# Patient Record
Sex: Male | Born: 1950 | Race: White | Hispanic: No | Marital: Married | Smoking: Never smoker
Health system: Southern US, Community
[De-identification: ages and names within clinical notes are randomized; demographics above are authoritative.]

## PROBLEM LIST (undated history)

## (undated) DIAGNOSIS — C61 Malignant neoplasm of prostate: Secondary | ICD-10-CM

## (undated) DIAGNOSIS — G4733 Obstructive sleep apnea (adult) (pediatric): Secondary | ICD-10-CM

## (undated) DIAGNOSIS — E119 Type 2 diabetes mellitus without complications: Secondary | ICD-10-CM

## (undated) DIAGNOSIS — I10 Essential (primary) hypertension: Secondary | ICD-10-CM

## (undated) HISTORY — DX: Essential (primary) hypertension: I10

## (undated) HISTORY — PX: KNEE SURGERY: SHX244

## (undated) HISTORY — DX: Obstructive sleep apnea (adult) (pediatric): G47.33

## (undated) HISTORY — DX: Malignant neoplasm of prostate: C61

## (undated) HISTORY — PX: POLYPECTOMY: SHX149

## (undated) HISTORY — PX: PROSTATE BIOPSY: SHX241

## (undated) HISTORY — PX: TONSILLECTOMY: SUR1361

## (undated) HISTORY — DX: Type 2 diabetes mellitus without complications: E11.9

---

## 2000-07-18 ENCOUNTER — Encounter: Payer: Self-pay | Admitting: Pulmonary Disease

## 2004-10-01 ENCOUNTER — Ambulatory Visit (HOSPITAL_COMMUNITY): Admission: RE | Admit: 2004-10-01 | Discharge: 2004-10-01 | Payer: Self-pay | Admitting: Internal Medicine

## 2005-12-17 ENCOUNTER — Encounter: Admission: RE | Admit: 2005-12-17 | Discharge: 2005-12-17 | Payer: Self-pay | Admitting: Internal Medicine

## 2006-02-18 ENCOUNTER — Encounter: Admission: RE | Admit: 2006-02-18 | Discharge: 2006-02-18 | Payer: Self-pay | Admitting: Internal Medicine

## 2007-11-21 ENCOUNTER — Encounter: Admission: RE | Admit: 2007-11-21 | Discharge: 2007-11-21 | Payer: Self-pay | Admitting: Internal Medicine

## 2009-06-05 ENCOUNTER — Ambulatory Visit: Payer: Self-pay | Admitting: Family Medicine

## 2009-06-05 DIAGNOSIS — I10 Essential (primary) hypertension: Secondary | ICD-10-CM

## 2009-06-05 DIAGNOSIS — M109 Gout, unspecified: Secondary | ICD-10-CM

## 2009-07-24 ENCOUNTER — Ambulatory Visit: Payer: Self-pay | Admitting: Family Medicine

## 2009-07-24 DIAGNOSIS — R197 Diarrhea, unspecified: Secondary | ICD-10-CM

## 2009-08-14 ENCOUNTER — Telehealth: Payer: Self-pay | Admitting: Family Medicine

## 2009-10-30 ENCOUNTER — Telehealth (INDEPENDENT_AMBULATORY_CARE_PROVIDER_SITE_OTHER): Payer: Self-pay | Admitting: *Deleted

## 2009-10-30 ENCOUNTER — Ambulatory Visit: Payer: Self-pay | Admitting: Family Medicine

## 2009-10-30 DIAGNOSIS — Z9189 Other specified personal risk factors, not elsewhere classified: Secondary | ICD-10-CM | POA: Insufficient documentation

## 2009-10-30 LAB — CONVERTED CEMR LAB
Glucose, Urine, Semiquant: NEGATIVE
Ketones, urine, test strip: NEGATIVE
Specific Gravity, Urine: >=1.03
WBC Urine, dipstick: NEGATIVE
pH: 5

## 2009-10-31 ENCOUNTER — Ambulatory Visit: Payer: Self-pay | Admitting: Family Medicine

## 2009-10-31 ENCOUNTER — Telehealth: Payer: Self-pay | Admitting: Family Medicine

## 2009-10-31 DIAGNOSIS — D696 Thrombocytopenia, unspecified: Secondary | ICD-10-CM

## 2009-10-31 DIAGNOSIS — R740 Nonspecific elevation of levels of transaminase and lactic acid dehydrogenase [LDH]: Secondary | ICD-10-CM

## 2009-10-31 LAB — CONVERTED CEMR LAB
AST: 123 units/L — ABNORMAL HIGH (ref 0–37)
Alkaline Phosphatase: 108 units/L (ref 39–117)
Basophils Absolute: 0 10*3/uL (ref 0.0–0.1)
Bilirubin, Direct: 0.2 mg/dL (ref 0.0–0.3)
Hemoglobin: 14.3 g/dL (ref 13.0–17.0)
Lymphocytes Relative: 8.8 % — ABNORMAL LOW (ref 12.0–46.0)
Monocytes Relative: 7.8 % (ref 3.0–12.0)
Neutro Abs: 4 10*3/uL (ref 1.4–7.7)
Neutrophils Relative %: 83.3 % — ABNORMAL HIGH (ref 43.0–77.0)
Platelets: 85 10*3/uL — ABNORMAL LOW (ref 150.0–400.0)
RDW: 13.7 % (ref 11.5–14.6)
Total Bilirubin: 1.1 mg/dL (ref 0.3–1.2)

## 2009-11-07 ENCOUNTER — Ambulatory Visit: Payer: Self-pay | Admitting: Family Medicine

## 2009-11-07 ENCOUNTER — Telehealth: Payer: Self-pay | Admitting: Internal Medicine

## 2009-11-12 LAB — CONVERTED CEMR LAB
Albumin: 4.5 g/dL (ref 3.5–5.2)
Basophils Absolute: 0 10*3/uL (ref 0.0–0.1)
HCT: 39.6 % (ref 39.0–52.0)
Lymphs Abs: 1.9 10*3/uL (ref 0.7–4.0)
MCHC: 34.8 g/dL (ref 30.0–36.0)
MCV: 85.3 fL (ref 78.0–100.0)
Monocytes Absolute: 0.5 10*3/uL (ref 0.1–1.0)
Neutro Abs: 5.9 10*3/uL (ref 1.4–7.7)
Platelets: 264 10*3/uL (ref 150.0–400.0)
RDW: 13.2 % (ref 11.5–14.6)
Total Bilirubin: 0.6 mg/dL (ref 0.3–1.2)

## 2009-12-12 ENCOUNTER — Ambulatory Visit: Payer: Self-pay | Admitting: Family Medicine

## 2009-12-17 LAB — CONVERTED CEMR LAB
AST: 25 units/L (ref 0–37)
Albumin: 4.5 g/dL (ref 3.5–5.2)
Cholesterol: 242 mg/dL — ABNORMAL HIGH (ref 0–200)
Direct LDL: 175.1 mg/dL
Total CHOL/HDL Ratio: 6
Total Protein: 6.8 g/dL (ref 6.0–8.3)
Triglycerides: 238 mg/dL — ABNORMAL HIGH (ref 0.0–149.0)
VLDL: 47.6 mg/dL — ABNORMAL HIGH (ref 0.0–40.0)

## 2010-01-10 ENCOUNTER — Encounter: Payer: Self-pay | Admitting: *Deleted

## 2010-02-05 ENCOUNTER — Telehealth: Payer: Self-pay | Admitting: Family Medicine

## 2010-02-12 ENCOUNTER — Encounter (INDEPENDENT_AMBULATORY_CARE_PROVIDER_SITE_OTHER): Payer: Self-pay | Admitting: *Deleted

## 2010-03-14 ENCOUNTER — Encounter (INDEPENDENT_AMBULATORY_CARE_PROVIDER_SITE_OTHER): Payer: Self-pay | Admitting: *Deleted

## 2010-03-17 ENCOUNTER — Ambulatory Visit: Payer: Self-pay | Admitting: Gastroenterology

## 2010-03-31 ENCOUNTER — Ambulatory Visit: Payer: Self-pay | Admitting: Gastroenterology

## 2010-04-02 ENCOUNTER — Encounter: Payer: Self-pay | Admitting: Gastroenterology

## 2010-04-07 ENCOUNTER — Ambulatory Visit: Payer: Self-pay | Admitting: Pulmonary Disease

## 2010-04-07 DIAGNOSIS — G4733 Obstructive sleep apnea (adult) (pediatric): Secondary | ICD-10-CM

## 2010-05-12 ENCOUNTER — Ambulatory Visit: Admit: 2010-05-12 | Payer: Self-pay | Admitting: Pulmonary Disease

## 2010-05-27 NOTE — Miscellaneous (Signed)
Summary: Pharmacy chg to [facility]  Clinical Lists Changes

## 2010-05-27 NOTE — Assessment & Plan Note (Signed)
Summary: new to est-ok per dr//ccm   Vital Signs:  Patient profile:   60 year old male Height:      73.50 inches Weight:      256 pounds BMI:     33.44 Temp:     98.8 degrees F oral Pulse rate:   80 / minute Pulse rhythm:   regular Resp:     12 per minute BP sitting:   160 / 90  (left arm) Cuff size:   large  Vitals Entered By: Sid Falcon LPN (June 05, 2009 2:16 PM)  Nutrition Counseling: Patient's BMI is greater than 25 and therefore counseled on weight management options.  Serial Vital Signs/Assessments:  Time      Position  BP       Pulse  Resp  Temp     By                     150/90                         Evelena Peat MD  CC: New to establish, gout history, current flare-up   History of Present Illness: New patient to establish care. Patient has history of hypertension, presumably adenomatous colon polyps, and gout.  Recent acute gout flare up involving left ankle. Used generic Lodine and no improvement until earlier today. Had significant warmth and pain with ambulation. Generally has about 3 gout flareups per year. No history of any clear tophaceous changes. Had gout for about 10 years. Adequate hydration seems to help. Previous flareups involving feet and hands.  Blood pressure treated with amlodipine, metoprolol, and Diovan HCTZ. Minimal alcohol use. He is generally aware of foods that exacerbate.  Preventive Screening-Counseling & Management  Alcohol-Tobacco     Smoking Status: never  Caffeine-Diet-Exercise     Does Patient Exercise: no  Allergies (verified): No Known Drug Allergies  Past History:  Family History: Last updated: 06/05/2009 Heart disease hypertension, parent, grandparent Diabetes, parent, grandparent  Social History: Last updated: 06/05/2009 Occupation:landscaping Married Alcohol use-yes, very occasional Never Smoked Regular exercise-no  Risk Factors: Exercise: no (06/05/2009)  Risk Factors: Smoking Status: never  (06/05/2009)  Past Medical History: Gout Hypertension PMH-FH-SH reviewed for relevance  Family History: Heart disease hypertension, parent, grandparent Diabetes, parent, grandparent  Social History: Occupation:landscaping Married Alcohol use-yes, very occasional Never Smoked Regular exercise-no Occupation:  employed Smoking Status:  never Does Patient Exercise:  no  Review of Systems      See HPI  The patient denies anorexia, fever, weight loss, weight gain, chest pain, dyspnea on exertion, abdominal pain, and severe indigestion/heartburn.    Physical Exam  General:  Well-developed,well-nourished,in no acute distress; alert,appropriate and cooperative throughout examination Mouth:  Oral mucosa and oropharynx without lesions or exudates.  Teeth in good repair. Neck:  No deformities, masses, or tenderness noted. Lungs:  Normal respiratory effort, chest expands symmetrically. Lungs are clear to auscultation, no crackles or wheezes. Heart:  Normal rate and regular rhythm. S1 and S2 normal without gallop, murmur, click, rub or other extra sounds. Extremities:  left ankle reveals significant swelling but no erythema or warmth. Mild to moderate tenderness laterally and medially. Skin:  no rashes noted   Impression & Recommendations:  Problem # 1:  GOUT (ICD-274.9)  possibly exacerbated by hydrochlorothiazide.  discussed acute treatment options. Has responded well to prednisone in the past. Prescription for Indocin will be given for acute future flareups  His updated  medication list for this problem includes:    Indomethacin 50 Mg Caps (Indomethacin) ..... One by mouth three times a day as needed gout  Problem # 2:  HYPERTENSION (ICD-401.9) suboptimally controlled today's reading.  have recommended blood pressure medication changes with getting rid of hydrochlorothiazide and titrate amlodipine 10 mg.  Work on weight loss. His updated medication list for this problem  includes:    Amlodipine Besylate 10 Mg Tabs (Amlodipine besylate) ..... One by mouth once daily    Diovan 320 Mg Tabs (Valsartan) ..... One po once daily    Metoprolol Succinate 100 Mg Xr24h-tab (Metoprolol succinate) ..... Once daily  Complete Medication List: 1)  Amlodipine Besylate 10 Mg Tabs (Amlodipine besylate) .... One by mouth once daily 2)  Diovan 320 Mg Tabs (Valsartan) .... One po once daily 3)  Metoprolol Succinate 100 Mg Xr24h-tab (Metoprolol succinate) .... Once daily 4)  Prednisone 10 Mg Tabs (Prednisone) .... Taper as follows: 4-4-4-3-3-2-1 5)  Indomethacin 50 Mg Caps (Indomethacin) .... One by mouth three times a day as needed gout  Patient Instructions: 1)  Drink plenty of fluids, especially water 2)  Limit red meats to 2 times or less per week 3)  Consider brief taper of prednisone for current flareup 4)  Please schedule a follow-up appointment in 1 month.  Prescriptions: DIOVAN 320 MG TABS (VALSARTAN) one po once daily  #30 x 11   Entered and Authorized by:   Evelena Peat MD   Signed by:   Evelena Peat MD on 06/05/2009   Method used:   Electronically to        [facility] [phone]* (retail)       [address] [place], [place]  [address]       Ph: [phone]       Fax: [phone]   RxID:   [id number] AMLODIPINE BESYLATE 10 MG TABS (AMLODIPINE BESYLATE) one by mouth once daily  #30 x 11   Entered and Authorized by:   Evelena Peat MD   Signed by:   Evelena Peat MD on 06/05/2009   Method used:   Electronically to        [facility] [address] [phone]* (retail)       [address] [place], [place]  [address]       Ph: [phone]       Fax: [phone]   RxID:   [id number] INDOMETHACIN 50 MG CAPS (INDOMETHACIN) one by mouth three times a day as needed gout  #30 x 1   Entered and Authorized by:   Evelena Peat MD   Signed by:   Evelena Peat MD on 06/05/2009   Method used:   Print then Give to Patient   RxID:    [id number] PREDNISONE 10 MG TABS (PREDNISONE) taper as follows: 4-4-4-3-3-2-1  #21 x 0   Entered and Authorized by:   Evelena Peat MD   Signed by:   Evelena Peat MD on 06/05/2009   Method used:   Print then Give to Patient   RxID:   [id number]

## 2010-05-27 NOTE — Assessment & Plan Note (Signed)
Summary: fever/chills/dm   Vital Signs:  Patient profile:   60 year old male Temp:     102 degrees F oral BP sitting:   150 / 68  (left arm) Cuff size:   large  Vitals Entered By: Sid Falcon LPN (October 30, 452 12:56 PM) CC: Fever, chills X 3  days   History of Present Illness: Patient is seen as a work in with onset 3 days ago of chills. He was at the [facility] and initially thought this was related to sun exposure.  subsequent developed fever up to 103.4 yesterday which did respond to Advil. He had some fatigue, ongoing fever and diffuse body aches. Some lower back stiffness but no urinary symptoms.  Patient has mild headache posterior occipital area which is improved with Advil and symptoms are off and on. Denies abdominal pain, diarrhea, skin rash, sore throat, earache, dysuria, or cough.  Patient has pulled off several ticks over the past 3 weeks including as recently as yesterday. He has not noted any skin rash. Minimal nausea but no vomiting.  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  The patient denies weight loss, weight gain, chest pain, prolonged cough, hemoptysis, abdominal pain, melena, hematochezia, severe indigestion/heartburn, hematuria, suspicious skin lesions, and enlarged lymph nodes.    Physical Exam  General:  Well-developed,well-nourished,in no acute distress; alert,appropriate and cooperative throughout examination Head:  Normocephalic and atraumatic without obvious abnormalities. No apparent alopecia or balding. Eyes:  pupils equal, pupils round, and pupils reactive to light.     Ears:  External ear exam shows no significant lesions or deformities.  Otoscopic examination reveals clear canals, tympanic membranes are intact bilaterally without bulging, retraction, inflammation or discharge. Hearing is grossly normal bilaterally. Mouth:  Oral mucosa and oropharynx without lesions or exudates.  Teeth in good repair. Neck:  No deformities, masses, or tenderness noted. Neck is supple. Lungs:  Normal respiratory effort, chest expands symmetrically. Lungs are clear to auscultation, no crackles or wheezes. Heart:  Normal rate and regular rhythm. S1 and S2 normal without gallop, murmur, click, rub or other extra sounds. Abdomen:  Bowel sounds positive,abdomen soft and non-tender without masses, organomegaly or hernias noted. Prostate:  Prostate gland firm and smooth, no enlargement, nodularity, tenderness, mass, asymmetry or induration. Done to r/o evid for prostatitis. Extremities:  No clubbing, cyanosis, edema, or deformity noted with normal full range of motion of all joints.   Neurologic:  alert & oriented X3, strength normal in all extremities, and gait normal.   Skin:  no rash noted Cervical Nodes:  No lymphadenopathy noted Psych:  normally interactive, good eye contact, not anxious appearing, and not depressed appearing.     Impression & Recommendations:  Problem # 1:  FEVER, HX OF (ICD-V15.9) Nonfocal exam. Obtain screening lab work. Even though no rash given history of tick bite cover with doxycycline. Discussed limitations in acute management of titers for things like [facility] spotted fever.  Patient is nontoxic in appearance this time.  No evid for UTI  clinically nothing to suggest pneumonia. Orders: UA Dipstick w/o Micro (manual) (09811) Venipuncture (91478) TLB-CBC Platelet - w/Differential (85025-CBCD) TLB-Hepatic/Liver Function Pnl (80076-HEPATIC)  Complete Medication List: 1)  Amlodipine Besylate 10 Mg Tabs (Amlodipine besylate) .... One by mouth once  daily 2)  Diovan 320 Mg Tabs (Valsartan) .... One po once daily 3)  Metoprolol Succinate 100 Mg Xr24h-tab (Metoprolol succinate) .... Once daily 4)  Indomethacin 50 Mg Caps (Indomethacin) .... One by mouth three times a day as needed gout 5)  Doxycycline Hyclate 100 Mg Caps (Doxycycline hyclate) .... One by mouth two times a day for 10 days  Patient Instructions: 1)  Schedule followup tomorrow to reassess 2)  Drink plenty of fluids 3)  Follow up immediately if you have any increased headache, confusion, worsening fever, or any new symptoms. Prescriptions: DOXYCYCLINE HYCLATE 100 MG CAPS (DOXYCYCLINE HYCLATE) one by mouth two times a day for 10 days  #20 x 0   Entered and Authorized by:   Evelena Peat MD   Signed by:   Evelena Peat MD on 10/30/2009   Method used:   Electronically to        [facility] [address] [phone]* (retail)       [address] [place], [place]  [address]       Ph: [phone]       Fax: [phone]   RxID:   [id number]   Laboratory Results   Urine Tests    Routine Urinalysis   Color: orange Appearance: Clear Glucose: negative   (Normal Range: Negative) Bilirubin: negative   (Normal Range: Negative) Ketone: negative   (Normal Range: Negative) Spec. Gravity: >=1.030   (Normal Range: 1.003-1.035) Blood: moderate   (Normal Range: Negative) pH: 5.0   (Normal Range: 5.0-8.0) Protein: 30   (Normal Range: Negative) Urobilinogen: 0.2   (Normal Range: 0-1) Nitrite: negative   (Normal Range: Negative) Leukocyte Esterace: negative   (Normal Range: Negative)    Comments: Sid Falcon LPN  October 30, 345 1:43 PM

## 2010-05-27 NOTE — Letter (Signed)
Summary: Results Letter  [facility]  [address] [place], [place] [address]   Phone: [phone]  Fax: [phone]        April 02, 2010 MRN: [id number]    Rodney Hughes [address] [place], [place]  [address]    Dear Mr. Llorens,   The polyps removed during your recent procedure was proven to be adenomatous.  These are pre-cancerous polyps that may have grown into cancers if they had not been removed.  Based on current nationally recognized surveillance guidelines, I recommend that you have a repeat colonoscopy in 5 years.  We will therefore put your information in our reminder system and will contact you in 5 years to schedule a repeat procedure.  Please call if you have any questions or concerns.       Sincerely,  Rachael Fee MD  This letter has been electronically signed by your physician.  Appended Document: Results Letter Letter mailed

## 2010-05-27 NOTE — Progress Notes (Signed)
Summary: Needs Alll Meds Refilled and Due for Colonoscopy  Phone Note Refill Request Call back at [phone]  on February 05, 2010 9:18 AM  Refills Requested: Medication #1:  AMLODIPINE BESYLATE 10 MG TABS one by mouth once daily   Notes: 90 day supply to [facility]  Medication #2:  DIOVAN 320 MG TABS one po once daily   Notes: 90 day supply to [facility]  Medication #3:  METOPROLOL SUCCINATE 100 MG XR24H-TAB once daily   Notes: 90 day supply to [facility]  Medication #4:  INDOMETHACIN 50 MG CAPS one by mouth three times a day as needed gout   Notes: 90 day supply to [facility]  Method Requested: Electronic Initial call taken by: Trixie Dredge,  February 05, 2010 9:19 AM Caller: Myrtie Hawk - PAM  Follow-up for Phone Call        Meds sent to [facility] as requested, as needed med for gout #90 only.  Wife requesting a colonscopy be scheduled at [facility], Wendall Papa MD requested.  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  Pt had colonscopy with [facility] 5 years ago.   Follow-up by: Sid Falcon LPN,  February 05, 2010 12:19 PM  Additional Follow-up for Phone Call Additional follow up Details #1::        referral made. Additional Follow-up by: Evelena Peat MD,  February 05, 2010 1:05 PM  New Problems: SPECIAL SCREENING FOR MALIGNANT NEOPLASMS COLON (ICD-V76.51)   Additional Follow-up for Phone Call Additional follow up Details #2::    Pt informed of meds and referral Follow-up by: Sid Falcon LPN,  February 05, 2010 2:48 PM  New Problems: SPECIAL SCREENING FOR MALIGNANT NEOPLASMS COLON (ICD-V76.51) Prescriptions: INDOMETHACIN 50 MG CAPS (INDOMETHACIN) one by mouth three times a day as needed gout  #90 x 0   Entered by:   Sid Falcon LPN   Authorized by:   Evelena Peat MD   Signed by:   Sid Falcon LPN on 62/13/0865   Method used:   Electronically to        [facility]* (retail)       [address]       [address] Shipping/mailing       [place], [place]  [address]       Ph: [phone]       Fax: [phone]   RxID:   [id number] DIOVAN 320 MG TABS (VALSARTAN) one po once daily  #90 x 3   Entered by:   Sid Falcon LPN   Authorized by:   Evelena Peat MD   Signed by:   Sid Falcon LPN on 75/64/3329   Method used:   Electronically to        [facility]* (retail)       [address]       [address] Shipping/mailing       [place], [place]  [address]       Ph: [phone]       Fax: [phone]   RxID:   [id number] AMLODIPINE BESYLATE 10 MG TABS (AMLODIPINE BESYLATE) one by mouth once daily  #90 x 3   Entered by:   Sid Falcon LPN   Authorized by:   Evelena Peat MD   Signed by:   Sid Falcon LPN on 31/51/7616   Method used:   Electronically to  [facility]* (retail)       [address]       [address] Shipping/mailing       [place], [place]  [address]       Ph: [phone]       Fax: [phone]   RxID:   [id number]   Pt is also due for colonoscopy, please schedule  Trixie Dredge  February 05, 2010 9:21 AM

## 2010-05-27 NOTE — Letter (Signed)
Summary: [facility] Instructions  [facility]  [address] [place], [place] [address]   Phone: [phone]  Fax: [phone]       Rodney Hughes    Jun 10, 1950    MRN: [id number]        Procedure Day Dorna Bloom:  Duanne Limerick  03/31/10     Arrival Time:  8:00AM     Procedure Time:  9:00AM     Location of Procedure:                    Juliann Pares _  [facility] (4th Floor)                      PREPARATION FOR COLONOSCOPY WITH MOVIPREP   Starting 5 days prior to your procedure 03/26/10 do not eat nuts, seeds, popcorn, corn, beans, peas,  salads, or any raw vegetables.  Do not take any fiber supplements (e.g. Metamucil, Citrucel, and Benefiber).  THE DAY BEFORE YOUR PROCEDURE         DATE: 03/30/10  DAY: MONDAY  1.  Drink clear liquids the entire day-NO SOLID FOOD  2.  Do not drink anything colored red or purple.  Avoid juices with pulp.  No orange juice.  3.  Drink at least 64 oz. (8 glasses) of fluid/clear liquids during the day to prevent dehydration and help the prep work efficiently.  CLEAR LIQUIDS INCLUDE: Water Jello Ice Popsicles Tea (sugar ok, no milk/cream) Powdered fruit flavored drinks Coffee (sugar ok, no milk/cream) Gatorade Juice: apple, white grape, white cranberry  Lemonade Clear bullion, consomm, broth Carbonated beverages (any kind) Strained chicken noodle soup Hard Candy                             4.  In the morning, mix first dose of MoviPrep solution:    Empty 1 Pouch A and 1 Pouch B into the disposable container    Add lukewarm drinking water to the top line of the container. Mix to dissolve    Refrigerate (mixed solution should be used within 24 hrs)  5.  Begin drinking the prep at 5:00 p.m. The MoviPrep container is divided by 4 marks.   Every 15 minutes drink the solution down to the next mark (approximately 8 oz) until the full liter is complete.   6.  Follow completed prep with 16 oz of clear liquid of your choice (Nothing red  or purple).  Continue to drink clear liquids until bedtime.  7.  Before going to bed, mix second dose of MoviPrep solution:    Empty 1 Pouch A and 1 Pouch B into the disposable container    Add lukewarm drinking water to the top line of the container. Mix to dissolve    Refrigerate  THE DAY OF YOUR PROCEDURE      DATE: 03/31/10   DAY: MONDAY  Beginning at 4:00PM (5 hours before procedure):         1. Every 15 minutes, drink the solution down to the next mark (approx 8 oz) until the full liter is complete.  2. Follow completed prep with 16 oz. of clear liquid of your choice.    3. You may drink clear liquids until 7:00PM (2 HOURS BEFORE PROCEDURE).   MEDICATION INSTRUCTIONS  Unless otherwise instructed, you should take regular prescription medications with a small sip of water   as early as possible the morning of  your procedure.           OTHER INSTRUCTIONS  You will need a responsible adult at least 58 years of age to accompany you and drive you home.   This person must remain in the waiting room during your procedure.  Wear loose fitting clothing that is easily removed.  Leave jewelry and other valuables at home.  However, you may wish to bring a book to read or  an iPod/MP3 player to listen to music as you wait for your procedure to start.  Remove all body piercing jewelry and leave at home.  Total time from sign-in until discharge is approximately 2-3 hours.  You should go home directly after your procedure and rest.  You can resume normal activities the  day after your procedure.  The day of your procedure you should not:   Drive   Make legal decisions   Operate machinery   Drink alcohol   Return to work  You will receive specific instructions about eating, activities and medications before you leave.    The above instructions have been reviewed and explained to me by   Sherren Kerns RN  March 17, 2010 4:34 PM    I fully understand and can  verbalize these instructions _____________________________ Date _________

## 2010-05-27 NOTE — Miscellaneous (Signed)
Summary: previsit prep/rm  Clinical Lists Changes  Medications: Added new medication of MOVIPREP 100 GM  SOLR (PEG-KCL-NACL-NASULF-NA ASC-C) As per prep instructions. - Signed Rx of MOVIPREP 100 GM  SOLR (PEG-KCL-NACL-NASULF-NA ASC-C) As per prep instructions.;  #1 x 0;  Signed;  Entered by: Sherren Kerns RN;  Authorized by: Rachael Fee MD;  Method used: Electronically to [facility]*, [address], [address] Shipping/mailing, [place], [place]  [address], Ph: [phone], Fax: [phone] Observations: Added new observation of ALLERGY REV: Done (03/17/2010 16:08)    Prescriptions: MOVIPREP 100 GM  SOLR (PEG-KCL-NACL-NASULF-NA ASC-C) As per prep instructions.  #1 x 0   Entered by:   Sherren Kerns RN   Authorized by:   Rachael Fee MD   Signed by:   Sherren Kerns RN on 03/17/2010   Method used:   Electronically to        [facility]* (retail)       [address]       [address] Shipping/mailing       [place], [place]  [address]       Ph: [phone]       Fax: [phone]   RxID:   [id number]

## 2010-05-27 NOTE — Progress Notes (Signed)
Summary: Fever returned 4pm, 101.8  Phone Note Call from Patient Call back at [facility] Phone [phone]   Caller: Patient Call For: Evelena Peat MD Summary of Call: VM from pt reporting 4pm today his fever returned, 101.8.  He will call with an update as to how the evening goes Initial call taken by: Sid Falcon LPN,  November 01, 2438 4:35 PM  Follow-up for Phone Call        called pt back and left message. Follow-up by: Evelena Peat MD,  October 31, 2009 5:45 PM  Additional Follow-up for Phone Call Additional follow up Details #1::        spoke with pt.  Overall feels better.  Fever is down at this time. Additional Follow-up by: Evelena Peat MD,  November 01, 2009 10:36 AM

## 2010-05-27 NOTE — Assessment & Plan Note (Signed)
Summary: DIARRHEA (POST TRIP TO [place]) // RS   Vital Signs:  Patient profile:   60 year old male Temp:     98.0 degrees F oral BP sitting:   130 / 80  (left arm) Cuff size:   large  Vitals Entered By: Sid Falcon LPN (July 24, 2009 3:26 PM) CC: [place] vacation, diarrhea X 3 days, Diarrhea   History of Present Illness:  Diarrhea      This is a 60 year old man who presents with Diarrhea.  The patient complains of >6 stools per day and watery/unformed stools, but denies blood in stool.  The patient denies fever, abdominal pain, abdominal cramps, nausea, and vomiting.  Recent travel to [place] but symptoms did not start until back for 2 days.  No Imodium or any other alleviating factors.  No recent antibiiotics.  Hx gout.  No flares since last visit.  Taken off HCTZ to avoid exacerbating.  Htn with increase amlodipine recently and tolerating well.  No headaches, dizziness, or edema.  R ant ankle pain worse with movement after periods of sitting and gradually improves some with walking.  No injury and no recent change of shoewear.  Has not iced or taken any antiinflammatories.  No redness or warmth.  Allergies (verified): No Known Drug Allergies  Past History:  Past Medical History: Last updated: 06/05/2009 Gout Hypertension PMH reviewed for relevance  Review of Systems      See HPI  Physical Exam  General:  Well-developed,well-nourished,in no acute distress; alert,appropriate and cooperative throughout examination Head:  Normocephalic and atraumatic without obvious abnormalities. No apparent alopecia or balding. Mouth:  Oral mucosa and oropharynx without lesions or exudates.  Teeth in good repair. Lungs:  Normal respiratory effort, chest expands symmetrically. Lungs are clear to auscultation, no crackles or wheezes. Heart:  Normal rate and regular rhythm. S1 and S2 normal without gallop, murmur, click, rub or other extra sounds. Abdomen:  soft, non-tender, normal bowel  sounds, no distention, no masses, no guarding, no rigidity, no hepatomegaly, and no splenomegaly.   Extremities:  right ankle reveals full range of motion. Minimal tenderness near anterior tendons of the ankle. No erythema or warmth. No bony tenderness. Good distal foot pulses. Skin:  Intact without suspicious lesions or rashes   Impression & Recommendations:  Problem # 1:  DIARRHEA (ICD-787.91) ?viral .  Pt does not appear signif dehydrated.  No fever, bloody stools, etc to suggest likely bacterial source.  Hydrate and try Imodium and close follow up if symptoms not improving next few days.  Problem # 2:  HYPERTENSION (ICD-401.9) Assessment: Improved  His updated medication list for this problem includes:    Amlodipine Besylate 10 Mg Tabs (Amlodipine besylate) ..... One by mouth once daily    Diovan 320 Mg Tabs (Valsartan) ..... One po once daily    Metoprolol Succinate 100 Mg Xr24h-tab (Metoprolol succinate) ..... Once daily  Problem # 3:  GOUT (ICD-274.9) Assessment: Improved  His updated medication list for this problem includes:    Indomethacin 50 Mg Caps (Indomethacin) ..... One by mouth three times a day as needed gout  Problem # 4:  OTHER ENTHESOPATHY OF ANKLE AND TARSUS (ICD-726.79) try icing and stretching.  Complete Medication List: 1)  Amlodipine Besylate 10 Mg Tabs (Amlodipine besylate) .... One by mouth once daily 2)  Diovan 320 Mg Tabs (Valsartan) .... One po once daily 3)  Metoprolol Succinate 100 Mg Xr24h-tab (Metoprolol succinate) .... Once daily 4)  Prednisone 10 Mg Tabs (Prednisone) .Marland KitchenMarland KitchenMarland Kitchen  Taper as follows: 4-4-4-3-3-2-1 5)  Indomethacin 50 Mg Caps (Indomethacin) .... One by mouth three times a day as needed gout  Patient Instructions: 1)  Drink plenty of fluids. 2)  Consider over the counter Imodium as needed for diarrhea. 3)  Call or follow up promptly if you notice any bloody stools, fever, or worsening diarrhea or if diarrhea not improving over the next few  days. 4)  Eat more potassium rich foods such as bananas, oranje juice, and salt substitutes .  5)  Consider icing R ankle several times daily.

## 2010-05-27 NOTE — Progress Notes (Signed)
Summary: Fever & Headache  Phone Note Call from Patient Call back at Home Phone [phone]   Caller: Patient Call For: Evelena Peat MD Summary of Call: Fever of 103 with headache for the past 4 days would like to know if he can be seen today?  Pt went to the beach this weekend, and Sunday night he began having back pain, and having ? of chills?  Fever was the first day of fever, and last night temp was 103 4.  Taking Advil since. Has headache, and no appetite, no URI symptoms, no vomiting or diarrhea, no tick bite.  Did find tick today.  No rash.   Initial call taken by: Trixie Dredge,  October 30, 2009 11:52 AM  Follow-up for Phone Call        Per Dr Caryl Never .......can see pt at 1 pm Follow-up by: Lynann Beaver CMA,  October 30, 2009 12:02 PM

## 2010-05-27 NOTE — Procedures (Signed)
Summary: Colonoscopy  Patient: Rodney Hughes Note: All result statuses are Final unless otherwise noted.  Tests: (1) Colonoscopy (COL)   COL Colonoscopy           DONE     [facility]     [address]     [place], [place]  [address]           COLONOSCOPY PROCEDURE REPORT           PATIENT:  [name], [name]  MR#:  [id number]     BIRTHDATE:  04-17-1951, 59 yrs. old  GENDER:  male     ENDOSCOPIST:  Rachael Fee, MD     REF. BY:  Evelena Peat, M.D.     PROCEDURE DATE:  03/31/2010     PROCEDURE:  Colonoscopy with snare polypectomy     ASA CLASS:  Class II     INDICATIONS:  had "cancerous polyp" removed 15 years ago in     [place].  Has had 4-5 colonoscopies since then, probably removal     of another polpy as well.     MEDICATIONS:   Fentanyl 50 mcg IV, Versed 5 mg IV     DESCRIPTION OF PROCEDURE:   After the risks benefits and     alternatives of the procedure were thoroughly explained, informed     consent was obtained.  Digital rectal exam was performed and     revealed no rectal masses.   The LB 180AL [id number] endoscope was     introduced through the anus and advanced to the cecum, which was     identified by both the appendix and ileocecal valve, without     limitations.  The quality of the prep was good, using MoviPrep.     The instrument was then slowly withdrawn as the colon was fully     examined.           <<PROCEDUREIMAGES>>     FINDINGS:  A sessile polyp was found in the ascending colon. This     was 4mm across, removed with cold snare and sent to pathology (jar     1) (see image4).  This was otherwise a normal examination of the     colon (see image2, image1, and image5).   Retroflexed views in the     rectum revealed no abnormalities.    The scope was then withdrawn     from the patient and the procedure completed.     COMPLICATIONS:  None           ENDOSCOPIC IMPRESSION:     1) Small sessile polyp in the ascending colon, removed and sent     to  pathology     2) Otherwise normal examination           RECOMMENDATIONS:     1) You will receive a letter within 1-2 weeks with the results     of your biopsy as well as final recommendations. Please call my     office if you have not received a letter after 3 weeks.  You will     likely need a repeat colonoscopy in 5 years     2) Dr. Christella Hartigan' office will get in touch with you in order to have     records sent from [place] colonoscopy, pathology report.  Also     will get records from [facility] sent over.           ______________________________  Rachael Fee, MD           n.     Rosalie Doctor:   Rachael Fee at 03/31/2010 09:27 AM           Docia Chuck, [id number]  Note: An exclamation mark (!) indicates a result that was not dispersed into the flowsheet. Document Creation Date: 03/31/2010 9:28 AM _______________________________________________________________________  (1) Order result status: Final Collection or observation date-time: 03/31/2010 09:21 Requested date-time:  Receipt date-time:  Reported date-time:  Referring Physician:   Ordering Physician: Rob Bunting [phone]) Specimen Source:  Source: Launa Grill Order Number: [id number] Lab site:   Appended Document: Colonoscopy     Procedures Next Due Date:    Colonoscopy: 03/2015

## 2010-05-27 NOTE — Letter (Signed)
Summary: Pre Visit Letter Revised  [facility]  [address] [place], [place] [address]   Phone: [phone]  Fax: [phone]        02/12/2010 MRN: [id number] Rodney Hughes [address] [place], [place]  [address]             Procedure Date:  03/31/2010   Welcome to the Gastroenterology Division at [facility].    You are scheduled to see a nurse for your pre-procedure visit on 03/17/2010 at 4:30PM on the 3rd floor at [facility], [address]  We ask that you try to arrive at our office 15 minutes prior to your appointment time to allow for check-in.  Please take a minute to review the attached form.  If you answer "Yes" to one or more of the questions on the first page, we ask that you call the person listed at your earliest opportunity.  If you answer "No" to all of the questions, please complete the rest of the form and bring it to your appointment.    Your nurse visit will consist of discussing your medical and surgical history, your immediate family medical history, and your medications.   If you are unable to list all of your medications on the form, please bring the medication bottles to your appointment and we will list them.  We will need to be aware of both prescribed and over the counter drugs.  We will need to know exact dosage information as well.    Please be prepared to read and sign documents such as consent forms, a financial agreement, and acknowledgement forms.  If necessary, and with your consent, a friend or relative is welcome to sit-in on the nurse visit with you.  Please bring your insurance card so that we may make a copy of it.  If your insurance requires a referral to see a specialist, please bring your referral form from your primary care physician.  No co-pay is required for this nurse visit.     If you cannot keep your appointment, please call [phone] to cancel or reschedule prior to your appointment date.  This  allows [place] the opportunity to schedule an appointment for another patient in need of care.    Thank you for choosing [facility] for your medical needs.  We appreciate the opportunity to care for you.  Please visit [place] at our website  to learn more about our practice.  Sincerely, The Gastroenterology Division

## 2010-05-27 NOTE — Assessment & Plan Note (Signed)
Summary: 1 day reassess per Dr. Hoover Grewe//alp   Vital Signs:  Patient profile:   60 year old male Temp:     98.0 degrees F oral BP sitting:   118 / 70  (left arm) Cuff size:   large  Vitals Entered By: Sid Falcon LPN (November 01, 9602 10:02 AM) CC: one day re-assess   History of Present Illness: Overall feels somewhat better.  Still some fever.  Nausea with Doxy. Still no rash.  Some body aches but improved last night. Very mild headache-better. Labs signif for low platelets and elev liver transaminases. No abdominal pain.  No vomiting today.  Allergies (verified): No Known Drug Allergies  Past History:  Past Medical History: Last updated: 06/05/2009 Gout Hypertension  Review of Systems      See HPI  Physical Exam  General:  Well-developed,well-nourished,in no acute distress; alert,appropriate and cooperative throughout examination Ears:  External ear exam shows no significant lesions or deformities.  Otoscopic examination reveals clear canals, tympanic membranes are intact bilaterally without bulging, retraction, inflammation or discharge. Hearing is grossly normal bilaterally. Mouth:  Oral mucosa and oropharynx without lesions or exudates.  Teeth in good repair. Neck:  No deformities, masses, or tenderness noted. supple Lungs:  Normal respiratory effort, chest expands symmetrically. Lungs are clear to auscultation, no crackles or wheezes. Heart:  Normal rate and regular rhythm. S1 and S2 normal without gallop, murmur, click, rub or other extra sounds. Skin:  no rashes.   Cervical Nodes:  No lymphadenopathy noted   Impression & Recommendations:  Problem # 1:  FEVER, HX OF (ICD-V15.9) Assessment Improved ?tick borne illness.  Low platelets and elev liver enzymes and hx tick bite compatible. Cont Doxycline.  consider acute hepatitis serologies if fever not continuing down next couple of days.  Problem # 2:  THROMBOCYTOPENIA (ICD-287.5) ?sec to tick  fever.  Problem # 3:  TRANSAMINASES, SERUM, ELEVATED (ICD-790.4) No hx of signif ETOH use.  Check acute Hep A serologies if not promptly improving. recheck one week.  Complete Medication List: 1)  Amlodipine Besylate 10 Mg Tabs (Amlodipine besylate) .... One by mouth once daily 2)  Diovan 320 Mg Tabs (Valsartan) .... One po once daily 3)  Metoprolol Succinate 100 Mg Xr24h-tab (Metoprolol succinate) .... Once daily 4)  Indomethacin 50 Mg Caps (Indomethacin) .... One by mouth three times a day as needed gout 5)  Doxycycline Hyclate 100 Mg Caps (Doxycycline hyclate) .... One by mouth two times a day for 10 days  Patient Instructions: 1)  Finish Doxycycline and follow up immediately if symptoms worsen in any way. 2)  Schedule the following labs in one week: 3)  CBC  287.5 4)  hepatic panel  790.4

## 2010-05-27 NOTE — Progress Notes (Signed)
Summary: pt is out  Phone Note Call from Patient Call back at [facility] Phone [phone]   Caller: Patient Call For: Evelena Peat MD Summary of Call: pt needs refill on metoprolol 100 mg call into [facility] [phone]. pt is out Initial call taken by: Heron Sabins,  November 07, 2009 3:41 PM  Follow-up for Phone Call        #90 Follow-up by: Gordy Savers  MD,  November 07, 2009 4:52 PM    Prescriptions: METOPROLOL SUCCINATE 100 MG XR24H-TAB (METOPROLOL SUCCINATE) once daily  #90 x 0   Entered by:   Lynann Beaver CMA   Authorized by:   Gordy Savers  MD   Signed by:   Lynann Beaver CMA on 11/07/2009   Method used:   Electronically to        [facility] [place] [id number] (retail)       [address] [place], [place]  [address]       Ph: [phone]       Fax: [phone]   RxID:   [id number]

## 2010-05-27 NOTE — Progress Notes (Signed)
Summary: Update on gout flare up  Phone Note Call from Patient   Caller: Spouse Call For: Evelena Peat MD Summary of Call: Wife reporting her husband has been limping the past few days, he did fill the Indomethacin.  With holiday weekend coming up I called pt in follow-up to see if he was doing OK, was the med helping.  He reported he was taking med three times a day and he was seeing improvement. Offered to see of one of the other providers would authorize refilling Prednisone, pt said no need at this time.  Pt was instructed to take the Indomethacin until his symptoms have resolved.  Pt voiced his understanding, he still has one refill at pharmacy.  Encouraged pt to call with questions, concerns regarding his gout symptoms. Initial call taken by: Sid Falcon LPN,  August 14, 2009 10:47 AM  Follow-up for Phone Call        agree with advice.  If continues to have freq flares we may wish to look at prophylactic meds.  We are hoping that eimination of his HCTZ (for blood pressure) will reduce his frequency of attacks. Follow-up by: Evelena Peat MD,  August 18, 2009 9:57 PM  Additional Follow-up for Phone Call Additional follow up Details #1::        PT INFORMED Additional Follow-up by: Willy Eddy, LPN,  August 19, 2009 9:23 AM

## 2010-05-29 NOTE — Assessment & Plan Note (Signed)
Summary: self referral for management of osa   Copy to:  self referral Primary Provider/Referring Provider:  Dr. Caryl Never MD  CC:  pt currently on a cpap but havent used x 2 years due to drying out his throat.  and Abdominal Pain.  History of Present Illness: The pt is a 59y/o male who comes in today as a self referral for management of osa.  He was diagnosed 10 years ago with osa, and has been on cpap since that time.  He is using the same machine, and does not have a heated humidifier.  He has not kept up with mask changes, and has had leaks with nasal mask (but unsure if due to mouth opening).  He states that he has not used his cpap consistently in 2 years due to the above issues.  He has been noted to have loud snoring, as well as an abnormal breathing pattern during sleep per wife.  He goes to bed at 10-11pm, and arises at 630am to start his day.  He is not completely rested upon arising in the am's, but denies daytime sleepiness with periods of inactivity.  He can doze however, in the evening while watching tv or movies.  He denies any sleepiness with driving.  His weight is neutral over the last 2 years.     Current Medications (verified): 1)  Amlodipine Besylate 10 Mg Tabs (Amlodipine Besylate) .... One By Mouth Once Daily 2)  Diovan 320 Mg Tabs (Valsartan) .... One Po Once Daily 3)  Metoprolol Succinate 100 Mg Xr24h-Tab (Metoprolol Succinate) .... Once Daily 4)  Indomethacin 50 Mg Caps (Indomethacin) .... One By Mouth Three Times A Day As Needed Gout  Allergies (verified): No Known Drug Allergies  Past History:  Past Medical History: Gout Hypertension OSA  Past Surgical History: polyps removed from colon tonsillectomy knee surgery  Family History: Reviewed history from 06/05/2009 and no changes required. hypertension, father dm--father allergies father  Social History: Reviewed history from 06/05/2009 and no changes  required. Occupation:landscaping Married Alcohol use-yes, very occasional Never Smoked Regular exercise-no  Review of Systems       The patient complains of nasal congestion/difficulty breathing through nose.  The patient denies shortness of breath with activity, shortness of breath at rest, productive cough, non-productive cough, coughing up blood, chest pain, irregular heartbeats, acid heartburn, indigestion, loss of appetite, weight change, abdominal pain, difficulty swallowing, sore throat, tooth/dental problems, headaches, sneezing, itching, ear ache, anxiety, depression, hand/feet swelling, joint stiffness or pain, rash, change in color of mucus, and fever.    Vital Signs:  Patient profile:   60 year old male Height:      73.50 inches Weight:      257.38 pounds BMI:     33.62 O2 Sat:      98 % on Room air Temp:     98.5 degrees F oral Pulse rate:   70 / minute BP sitting:   130 / 78  (left arm) Cuff size:   large  Vitals Entered By: Carver Fila (April 07, 2010 3:06 PM)  O2 Flow:  Room air CC: pt currently on a cpap but havent used x 2 years due to drying out his throat. , Abdominal Pain Comments meds and allergies updated Phone number updated Carver Fila  April 07, 2010 3:07 PM    Physical Exam  General:  ow male in nad  Eyes:  PERRLA and EOMI.   Nose:  narrowed bilat with mucosal edema and erythema Mouth:  small posterior pharyngeal space, elongation of soft palate and uvula. Neck:  no jvd, tmg, LN Lungs:  clear to auscultation Heart:  rrr, no mrg  Abdomen:  soft and nontender, bs+ Extremities:  no edema or cyanosis  pulses intact distally Neurologic:  alert and oriented, does not appear sleepy, moves all 4.   Impression & Recommendations:  Problem # 1:  OBSTRUCTIVE SLEEP APNEA (ICD-327.23) the pt has a history osa dating back 10 years, but has not been compliant with cpap over the last 2 years due to machine and mask issues.  He is having a lot of nasal  dryness due to lack of heated humidifier, is unsure if his machine is set properly or even working properly, and has old masks that are leaking.  At this point, will need a new machine and mask, and will re-optimize his pressure on auto mode.  I have also encouraged him to work on weight loss.  Other Orders: New Patient Level IV ([address]) DME Referral (DME)  Patient Instructions: 1)  will get you a new machine, mask, and supplies.  Will start out on auto pressure mode for the next 2-3 weeks, then will set up on a fixed pressure based on the download.  Please call if having issues with tolerance. 2)  work on weight loss 3)  followup with me in 5 weeks.   Immunization History:  Influenza Immunization History:    Influenza:  historical (01/25/2009)

## 2010-08-20 ENCOUNTER — Encounter: Payer: Self-pay | Admitting: Pulmonary Disease

## 2010-08-22 ENCOUNTER — Encounter: Payer: Self-pay | Admitting: Pulmonary Disease

## 2010-08-25 ENCOUNTER — Ambulatory Visit (INDEPENDENT_AMBULATORY_CARE_PROVIDER_SITE_OTHER): Payer: 59 | Admitting: Pulmonary Disease

## 2010-08-25 ENCOUNTER — Encounter: Payer: Self-pay | Admitting: Pulmonary Disease

## 2010-08-25 VITALS — BP 148/92 | HR 64 | Temp 98.0°F | Ht 74.0 in | Wt 253.8 lb

## 2010-08-25 DIAGNOSIS — G4733 Obstructive sleep apnea (adult) (pediatric): Secondary | ICD-10-CM

## 2010-08-25 NOTE — Progress Notes (Signed)
  Subjective:    Patient ID: Rodney Hughes, male    DOB: 1951/02/16, 60 y.o.   MRN: [id number]  HPI The pt comes in today for f/u of his osa.  His download from Jan to March shows poor compliance overall, but he states the last 4 weeks he has done much better.  He denies any issues with mask fit, and thinks he is sleeping better with increased alertness.  His optimal pressure appears to be 12cm.    Review of Systems  Constitutional: Negative for fever and unexpected weight change.  HENT: Positive for rhinorrhea. Negative for ear pain, nosebleeds, congestion, sore throat, sneezing, trouble swallowing, dental problem, postnasal drip and sinus pressure.   Eyes: Positive for redness and itching.  Respiratory: Negative for cough, chest tightness, shortness of breath and wheezing.   Cardiovascular: Negative for palpitations and leg swelling.  Gastrointestinal: Negative for nausea and vomiting.  Genitourinary: Negative for dysuria.  Musculoskeletal: Negative for joint swelling.  Skin: Negative for rash.  Neurological: Negative for headaches.  Hematological: Does not bruise/bleed easily.  Psychiatric/Behavioral: Negative for dysphoric mood. The patient is not nervous/anxious.        Objective:   Physical Exam Ow male in nad No skin breakdown or pressure necrosis from cpap mask LE without edema, no cyanosis noted. Alert , oriented, does not appear sleepy.  Moves all 4        Assessment & Plan:

## 2010-08-25 NOTE — Assessment & Plan Note (Signed)
The pt is on cpap in auto mode currently, and although his overall compliance is poor, his states his most recent compliance is improved.  He would like to continue with therapy, and will have his machine set on a pressure of 12cm based on the download.  I have also encouraged him to work on weight loss.

## 2010-08-25 NOTE — Patient Instructions (Signed)
Will have advanced increase your pressure to 12 on set mode Decide if you need to have ramp feature or not.  You can turn this off if not.  Work on weight loss followup with me in 6mos

## 2010-11-03 ENCOUNTER — Other Ambulatory Visit: Payer: Self-pay | Admitting: Family Medicine

## 2010-12-03 ENCOUNTER — Other Ambulatory Visit: Payer: Self-pay | Admitting: Family Medicine

## 2010-12-03 MED ORDER — METOPROLOL SUCCINATE ER 100 MG PO TB24: 100.0000 mg | ORAL_TABLET | Freq: Every day | ORAL | Status: DC

## 2010-12-03 NOTE — Telephone Encounter (Signed)
Pt's wife called 8/8. Husband has a f/u appt on 8/13 but does not have enough Metroprolol to get him through. Wife would like 1-2 pills called in to the new [facility] pharmacy over at [place].

## 2010-12-03 NOTE — Telephone Encounter (Signed)
Per dr. Caryl Never - ok 14 - keep appt next week. kik

## 2010-12-08 ENCOUNTER — Ambulatory Visit (INDEPENDENT_AMBULATORY_CARE_PROVIDER_SITE_OTHER): Payer: 59 | Admitting: Family Medicine

## 2010-12-08 ENCOUNTER — Telehealth: Payer: Self-pay | Admitting: Family Medicine

## 2010-12-08 ENCOUNTER — Encounter: Payer: Self-pay | Admitting: Family Medicine

## 2010-12-08 DIAGNOSIS — G4733 Obstructive sleep apnea (adult) (pediatric): Secondary | ICD-10-CM

## 2010-12-08 DIAGNOSIS — M109 Gout, unspecified: Secondary | ICD-10-CM

## 2010-12-08 DIAGNOSIS — I10 Essential (primary) hypertension: Secondary | ICD-10-CM

## 2010-12-08 NOTE — Telephone Encounter (Signed)
I routed a phone note back on this pt dated 8/8. His wife, who works at [employer], requested that his Metoprolol be called in to the [facility] out pt pharmacy - the new one - at [address]. It looks like it was called in to the one at [facility]. She just called back, somewhat miffed, and would like [place] to re-route it over to the one on [address] ASAP as her husband is out of the meds and her daughter is waiting to pick it up.

## 2010-12-08 NOTE — Progress Notes (Signed)
  Subjective:    Patient ID: Rodney Hughes, male    DOB: 1950-05-11, 60 y.o.   MRN: [id number]  HPI Medical followup. Past medical history significant for gout, hypertension, obstructive sleep apnea, and prior history of elevated liver transaminases. Medications reviewed. Compliant with all. Home blood pressures mostly ranging 118 -140 systolic and 80s diastolic. Denies any dizziness or orthostasis. No headaches. No recent chest pains. Stays active physically with landscaping work.  History of gout with less frequent flareups since stopping hydrochlorothiazide over one year ago. Has taken Indocin in the past for flareups. He does not report any gout flares in the past one year. Obstructive sleep apnea and recently had equipment replaced with auto titration unit.  Overall feels well  Has had some fleeting numbness in both feet. Not related to shoe wear. Check blood sugar with his wife's equipment and fasting blood sugar was normal.  Past Medical History  Diagnosis Date  . Gout   . Hypertension   . OSA (obstructive sleep apnea)    Past Surgical History  Procedure Date  . Polypectomy   . Tonsillectomy   . Knee surgery     reports that he has never smoked. He does not have any smokeless tobacco history on file. He reports that he drinks alcohol. His drug history not on file. family history includes Allergies in his father; Diabetes in his father; and Hypertension in his father. No Known Allergies   Review of Systems  Constitutional: Negative for fever, appetite change and unexpected weight change.  Respiratory: Negative for cough and shortness of breath.   Cardiovascular: Negative for chest pain, palpitations and leg swelling.  Gastrointestinal: Negative for abdominal pain.  Musculoskeletal: Negative for myalgias.  Neurological: Negative for dizziness and headaches.       Objective:   Physical Exam  Constitutional: He is oriented to person, place, and time. He appears  well-developed and well-nourished.  Eyes: Pupils are equal, round, and reactive to light.  Neck: Neck supple. No thyromegaly present.  Cardiovascular: Normal rate and regular rhythm.  Exam reveals no gallop.   No murmur heard. Pulmonary/Chest: Breath sounds normal. No respiratory distress. He has no wheezes. He has no rales.  Musculoskeletal: He exhibits no edema.  Lymphadenopathy:    He has no cervical adenopathy.  Neurological: He is alert and oriented to person, place, and time.  Psychiatric: He has a normal mood and affect. His behavior is normal.          Assessment & Plan:  #1 hypertension. Marginal control. Prefer that he work on weight loss and reassess him in 6 months. Continue home monitoring.  He will try to establish more regular exercise #2 gout.  Infrequent flareups of this time. No indication for prophylaxis #3 obstructive sleep apnea. Continue weight loss efforts and CPAP

## 2010-12-08 NOTE — Telephone Encounter (Signed)
I spoke with [facility] Pharmacist and  [facility] Pharmacist, they are both able to see refill authorization, no daughter at [facility] [employer]???  I see that pt is scheduled for appt this afternoon.  Pt last OV was 10/2009.  I filled Metoprolol in July #30 with 0 refills, informed pharmacy pt needed return OV, another refill request came in August, refilled #14 with 0 refills, again reminding pt needs return OV.  The refill requests came electronically from the [facility], so that is where I sent the refills.

## 2010-12-08 NOTE — Telephone Encounter (Signed)
Refills done at OV today

## 2011-01-01 ENCOUNTER — Other Ambulatory Visit: Payer: Self-pay | Admitting: Family Medicine

## 2011-02-02 ENCOUNTER — Other Ambulatory Visit: Payer: Self-pay | Admitting: Family Medicine

## 2011-02-23 ENCOUNTER — Ambulatory Visit: Payer: 59 | Admitting: Pulmonary Disease

## 2011-04-03 ENCOUNTER — Encounter: Payer: Self-pay | Admitting: Pulmonary Disease

## 2011-04-03 ENCOUNTER — Ambulatory Visit (INDEPENDENT_AMBULATORY_CARE_PROVIDER_SITE_OTHER): Payer: 59 | Admitting: Pulmonary Disease

## 2011-04-03 VITALS — BP 150/84 | HR 60 | Temp 98.1°F | Ht 74.0 in | Wt 256.6 lb

## 2011-04-03 DIAGNOSIS — G4733 Obstructive sleep apnea (adult) (pediatric): Secondary | ICD-10-CM

## 2011-04-03 NOTE — Progress Notes (Signed)
  Subjective:    Patient ID: Rodney Hughes, male    DOB: Mar 06, 1951, 60 y.o.   MRN: [id number]  HPI The patient comes in today for followup of his obstructive sleep apnea.  He is wearing CPAP compliantly, and denies any issue with the mask fit or pressure.  He feels that he is sleeping well, and has excellent daytime alertness.   Review of Systems  Constitutional: Negative for fever and unexpected weight change.  HENT: Negative for ear pain, nosebleeds, congestion, sore throat, rhinorrhea, sneezing, trouble swallowing, dental problem, postnasal drip and sinus pressure.   Eyes: Negative for redness and itching.  Respiratory: Negative for cough, chest tightness, shortness of breath and wheezing.   Cardiovascular: Negative for palpitations and leg swelling.  Gastrointestinal: Negative for nausea and vomiting.  Genitourinary: Negative for dysuria.  Musculoskeletal: Negative for joint swelling.  Skin: Negative for rash.  Neurological: Negative for headaches.  Hematological: Does not bruise/bleed easily.  Psychiatric/Behavioral: Negative for dysphoric mood. The patient is not nervous/anxious.        Objective:   Physical Exam Overweight male in no acute distress No skin breakdown or pressure necrosis from the CPAP mask Lower extremities without edema, no cyanosis Alert and oriented, does not appear to be sleepy, moves all 4 extremities.       Assessment & Plan:

## 2011-04-03 NOTE — Assessment & Plan Note (Signed)
The patient is doing very well on CPAP, with improvement in his symptoms.  I have asked him to continue on CPAP, keep up with mask changes and supplies, and to work aggressively on weight loss.  He is to followup with me in one year he is doing well.

## 2011-04-03 NOTE — Patient Instructions (Signed)
Continue with cpap. Work on weight loss followup with me in one year.  

## 2011-05-05 ENCOUNTER — Other Ambulatory Visit: Payer: Self-pay | Admitting: Family Medicine

## 2011-05-05 ENCOUNTER — Other Ambulatory Visit: Payer: Self-pay | Admitting: *Deleted

## 2011-05-05 MED ORDER — METOPROLOL SUCCINATE ER 100 MG PO TB24: 100.0000 mg | ORAL_TABLET | Freq: Every day | ORAL | Status: DC

## 2011-06-03 ENCOUNTER — Other Ambulatory Visit (INDEPENDENT_AMBULATORY_CARE_PROVIDER_SITE_OTHER): Payer: 59

## 2011-06-03 DIAGNOSIS — Z0000 Encounter for general adult medical examination without abnormal findings: Secondary | ICD-10-CM

## 2011-06-03 LAB — CBC WITH DIFFERENTIAL/PLATELET
Basophils Relative: 0.3 % (ref 0.0–3.0)
Eosinophils Relative: 6.1 % — ABNORMAL HIGH (ref 0.0–5.0)
MCV: 88.3 fl (ref 78.0–100.0)
Monocytes Absolute: 0.4 10*3/uL (ref 0.1–1.0)
Monocytes Relative: 7.9 % (ref 3.0–12.0)
Neutrophils Relative %: 61.1 % (ref 43.0–77.0)
Platelets: 132 10*3/uL — ABNORMAL LOW (ref 150.0–400.0)
RBC: 4.46 Mil/uL (ref 4.22–5.81)
WBC: 5.5 10*3/uL (ref 4.5–10.5)

## 2011-06-03 LAB — BASIC METABOLIC PANEL
BUN: 18 mg/dL (ref 6–23)
Chloride: 106 mEq/L (ref 96–112)
Creatinine, Ser: 1.1 mg/dL (ref 0.4–1.5)
GFR: 73.03 mL/min (ref >60.00)
Potassium: 4.4 mEq/L (ref 3.5–5.1)

## 2011-06-03 LAB — POCT URINALYSIS DIPSTICK
Bilirubin, UA: NEGATIVE
Blood, UA: NEGATIVE
Glucose, UA: NEGATIVE
Leukocytes, UA: NEGATIVE
Nitrite, UA: NEGATIVE
Urobilinogen, UA: 0.2

## 2011-06-03 LAB — HEPATIC FUNCTION PANEL
ALT: 19 U/L (ref 0–53)
AST: 22 U/L (ref 0–37)
Alkaline Phosphatase: 52 U/L (ref 39–117)
Bilirubin, Direct: 0.1 mg/dL (ref 0.0–0.3)
Total Bilirubin: 0.9 mg/dL (ref 0.3–1.2)
Total Protein: 6.8 g/dL (ref 6.0–8.3)

## 2011-06-03 LAB — TSH: TSH: 2.41 u[IU]/mL (ref 0.35–5.50)

## 2011-06-03 LAB — LIPID PANEL
LDL Cholesterol: 135 mg/dL — ABNORMAL HIGH (ref 0–99)
Total CHOL/HDL Ratio: 5
Triglycerides: 44 mg/dL (ref 0.0–149.0)

## 2011-06-10 ENCOUNTER — Ambulatory Visit (INDEPENDENT_AMBULATORY_CARE_PROVIDER_SITE_OTHER): Payer: 59 | Admitting: Family Medicine

## 2011-06-10 ENCOUNTER — Encounter: Payer: Self-pay | Admitting: Family Medicine

## 2011-06-10 VITALS — BP 122/70 | HR 80 | Temp 97.9°F | Resp 12 | Ht 73.5 in | Wt 243.0 lb

## 2011-06-10 DIAGNOSIS — Z0000 Encounter for general adult medical examination without abnormal findings: Secondary | ICD-10-CM

## 2011-06-10 DIAGNOSIS — Z299 Encounter for prophylactic measures, unspecified: Secondary | ICD-10-CM

## 2011-06-10 MED ORDER — TETANUS-DIPHTH-ACELL PERTUSSIS 5-2.5-18.5 LF-MCG/0.5 IM SUSP: 0.5000 mL | Freq: Once | INTRAMUSCULAR | Status: DC

## 2011-06-10 NOTE — Patient Instructions (Signed)
Continue exercise and weight loss efforts.   Your labs (esp cholesterol) are greatly improved! Reduce sugars and starches.

## 2011-06-10 NOTE — Progress Notes (Signed)
  Subjective:    Patient ID: Rodney Hughes, male    DOB: 09-20-1950, 61 y.o.   MRN: [id number]  HPI  Complete physical. Past medical history reviewed. He has history of obstructive sleep apnea, gout, and hypertension. His gout has done well since stopping hydrochlorothiazide.  He has lost some 15 pounds over the past 6 months due to exercise and dietary change. Feels much better overall. No dizziness. Compliant with medications. Last tetanus unknown. No flu vaccine yet. Colonoscopy 2011 normal with the exception of small diminutive polyp  Past Medical History  Diagnosis Date  . Gout   . Hypertension   . OSA (obstructive sleep apnea)    Past Surgical History  Procedure Date  . Polypectomy   . Tonsillectomy   . Knee surgery     reports that he has never smoked. He does not have any smokeless tobacco history on file. He reports that he drinks alcohol. His drug history not on file. family history includes Allergies in his father; Diabetes in his father; and Hypertension in his father. No Known Allergies     Review of Systems  Constitutional: Negative for fever, activity change, appetite change and fatigue.  HENT: Negative for ear pain, congestion and trouble swallowing.   Eyes: Negative for pain and visual disturbance.  Respiratory: Negative for cough, shortness of breath and wheezing.   Cardiovascular: Negative for chest pain and palpitations.  Gastrointestinal: Negative for nausea, vomiting, abdominal pain, diarrhea, constipation, blood in stool, abdominal distention and rectal pain.  Genitourinary: Negative for dysuria, hematuria and testicular pain.  Musculoskeletal: Negative for joint swelling and arthralgias.  Skin: Negative for rash.  Neurological: Negative for dizziness, syncope and headaches.  Hematological: Negative for adenopathy.  Psychiatric/Behavioral: Negative for confusion and dysphoric mood.       Objective:   Physical Exam  Constitutional: He is oriented to  person, place, and time. He appears well-developed and well-nourished. No distress.  HENT:  Head: Normocephalic and atraumatic.  Right Ear: External ear normal.  Left Ear: External ear normal.  Mouth/Throat: Oropharynx is clear and moist.  Eyes: Conjunctivae and EOM are normal. Pupils are equal, round, and reactive to light.  Neck: Normal range of motion. Neck supple. No thyromegaly present.  Cardiovascular: Normal rate, regular rhythm and normal heart sounds.   No murmur heard. Pulmonary/Chest: No respiratory distress. He has no wheezes. He has no rales.  Abdominal: Soft. Bowel sounds are normal. He exhibits no distension and no mass. There is no tenderness. There is no rebound and no guarding.  Genitourinary: Rectum normal and prostate normal.  Musculoskeletal: He exhibits no edema.  Lymphadenopathy:    He has no cervical adenopathy.  Neurological: He is alert and oriented to person, place, and time. He displays normal reflexes. No cranial nerve deficit.  Skin: No rash noted.  Psychiatric: He has a normal mood and affect.          Assessment & Plan:  Complete physical. Tremendous job with lifestyle modification. Blood pressure improved control. Lipids greatly reduced with weight loss efforts. Tetanus booster given. Colonoscopy up to date. Recommend flu vaccine next year

## 2012-03-14 ENCOUNTER — Ambulatory Visit (INDEPENDENT_AMBULATORY_CARE_PROVIDER_SITE_OTHER): Payer: BC Managed Care – PPO | Admitting: Family Medicine

## 2012-03-14 DIAGNOSIS — R5381 Other malaise: Secondary | ICD-10-CM

## 2012-03-14 DIAGNOSIS — R531 Weakness: Secondary | ICD-10-CM

## 2012-03-14 DIAGNOSIS — R630 Anorexia: Secondary | ICD-10-CM

## 2012-03-14 LAB — POCT URINALYSIS DIPSTICK
Bilirubin, UA: NEGATIVE
Blood, UA: NEGATIVE
Glucose, UA: NEGATIVE
Spec Grav, UA: 1.02
Urobilinogen, UA: 0.2

## 2012-03-14 NOTE — Progress Notes (Signed)
Subjective:     Patient ID: Rodney Hughes, male   DOB: 1951/03/18, 61 y.o.   MRN: [id number]  HPI  61 year old with history of hypertension and gout here for evaluation following visit to hospital in [place] last week while on cruise.  He states that Tues night last week on the cruise after dinner, he suddenly began to feel pain across his lower back and developed nausea and eventually vomited.  He felt like moving into the fetal position was somewhat helpful.  The next morning, he said he vomited again a large amount, and went to a hospital where he received IVF, an abdominal U/S and IV pain meds.  He did not have any abdominal pain.  Was noted to have WBC 14.8 on CBC, but otherwise normal.  Doctors in [place] mentioned to pt something about L spot that could have been nephrolithiasis, but findings were not specific.  Since then, he has been feeling some weakness in his legs, and in general has not had much of an appetite, and experiences nausea with the smell of certain foods.  Denies fever, abdominal pain, diarrhea, hematuria or dysuria.  Review of Systems  Constitutional: Positive for appetite change and fatigue. Negative for fever, chills and unexpected weight change.  Respiratory: Negative for cough, chest tightness and shortness of breath.   Cardiovascular: Negative for chest pain.  Gastrointestinal: Positive for nausea. Negative for vomiting, abdominal pain, diarrhea, constipation, blood in stool, abdominal distention and rectal pain.  Genitourinary: Negative for dysuria, hematuria, flank pain and difficulty urinating.  Neurological: Positive for weakness.       Objective:   Physical Exam  Constitutional: He is oriented to person, place, and time. He appears well-developed and well-nourished. No distress.  HENT:  Head: Normocephalic and atraumatic.  Cardiovascular: Normal rate, regular rhythm and normal heart sounds.   Pulmonary/Chest: Effort normal and breath sounds normal. No  respiratory distress.  Abdominal: Soft. Bowel sounds are normal. He exhibits no distension and no mass. There is no tenderness. There is no rebound and no guarding.  Neurological: He is alert and oriented to person, place, and time.       Assessment:     61 year old here for evaluation of weakness and nausea following hospital visit while in [place] last week.    Plan:     1. Weakness/decreased appetite: unclear etiology at this time.  Per discharge documents from hospital in [place], it appeared their assumption was nephrolithiasis given U/S findings, and a note about giving tamsulosin.  Pt also felt better after IVF and IV pain meds.  Unlikely viral illness or food poisoning given onset of symptoms so soon after eating, and pt was the only one on the cruise affected.  Residual GI symptoms of decreased appetite and nausea do not fit as well with nephrolithiasis.  Check CBC w diff, BMP, hepatic function, and urinalysis now and will reassess further workup pending results.  WBC should be lower now compared to in [place], but if not, further workup will be warranted.  Marthann Schiller, MS3     Agree with assessment and plan as per Marthann Schiller, MS 3 No evidence for active infectious symptoms at this time.  Symptoms were very acute.  He did not have any diarrhea to suggest likely infectious colitis.  ?kidney stone.  No significant residual pain but has nonspecific decrease appetite and weakness at this time. Evelena Peat MD

## 2012-03-14 NOTE — Patient Instructions (Signed)
Follow up immediately if you experience any severe lower back pain, fever, or vomiting.

## 2012-03-15 LAB — HEPATIC FUNCTION PANEL
ALT: 18 U/L (ref 0–53)
Albumin: 4.2 g/dL (ref 3.5–5.2)
Bilirubin, Direct: 0.1 mg/dL (ref 0.0–0.3)
Total Protein: 7.8 g/dL (ref 6.0–8.3)

## 2012-03-15 LAB — CBC WITH DIFFERENTIAL/PLATELET
Basophils Relative: 1.7 % (ref 0.0–3.0)
Eosinophils Relative: 1.6 % (ref 0.0–5.0)
Hemoglobin: 13.2 g/dL (ref 13.0–17.0)
Lymphocytes Relative: 15.7 % (ref 12.0–46.0)
Monocytes Relative: 6.1 % (ref 3.0–12.0)
Neutro Abs: 7.8 10*3/uL — ABNORMAL HIGH (ref 1.4–7.7)
Neutrophils Relative %: 74.9 % (ref 43.0–77.0)
RBC: 4.52 Mil/uL (ref 4.22–5.81)
WBC: 10.5 10*3/uL (ref 4.5–10.5)

## 2012-03-15 LAB — BASIC METABOLIC PANEL
CO2: 29 mEq/L (ref 19–32)
Calcium: 9.5 mg/dL (ref 8.4–10.5)
Creatinine, Ser: 1.9 mg/dL — ABNORMAL HIGH (ref 0.4–1.5)
GFR: 37.84 mL/min — ABNORMAL LOW (ref >60.00)
Sodium: 136 mEq/L (ref 135–145)

## 2012-03-16 ENCOUNTER — Other Ambulatory Visit: Payer: Self-pay | Admitting: *Deleted

## 2012-03-16 DIAGNOSIS — E860 Dehydration: Secondary | ICD-10-CM

## 2012-03-16 NOTE — Progress Notes (Signed)
Quick Note:  Pt informed and voiced undersatanding ______

## 2012-03-22 ENCOUNTER — Telehealth: Payer: Self-pay | Admitting: Family Medicine

## 2012-03-22 ENCOUNTER — Other Ambulatory Visit (INDEPENDENT_AMBULATORY_CARE_PROVIDER_SITE_OTHER): Payer: BC Managed Care – PPO

## 2012-03-22 DIAGNOSIS — E860 Dehydration: Secondary | ICD-10-CM

## 2012-03-22 LAB — BASIC METABOLIC PANEL
BUN: 24 mg/dL — ABNORMAL HIGH (ref 6–23)
Chloride: 97 mEq/L (ref 96–112)
GFR: 40.48 mL/min — ABNORMAL LOW (ref >60.00)
Glucose, Bld: 83 mg/dL (ref 70–99)
Potassium: 3.7 mEq/L (ref 3.5–5.1)
Sodium: 134 mEq/L — ABNORMAL LOW (ref 135–145)

## 2012-03-22 NOTE — Telephone Encounter (Signed)
1.) pt did have labs done Tues. Morning, concerned about his kidneys 2.) has been nauseated off and on, unsure why, is some better today. 3.) has been "flat on his back this week sick (along with the rest of the family) with fatigue, cough, chest congestion, denied fever. 4.) pt concerned about Thanksgiving and MAY call early for OV tomorrow if he "takes another turn for the worse tonight".

## 2012-03-22 NOTE — Telephone Encounter (Signed)
Patient Information:  Caller Name: Joas  Phone: [phone]  Patient: Rodney Hughes, Rodney Hughes  Gender: Male  DOB: Aug 25, 1950  Age: 61 Years  PCP: Evelena Peat (Family Practice)   Symptoms  Reason For Call & Symptoms: Nausea with smells of foods, Headache,  Reviewed Health History In EMR: Yes  Reviewed Medications In EMR: Yes  Reviewed Allergies In EMR: Yes  Date of Onset of Symptoms: 03/14/2012  Guideline(s) Used:  No Protocol Available - Sick Adult  Disposition Per Guideline:   Discuss with PCP and Callback by Nurse within 1 Hour  Reason For Disposition Reached:   Nursing judgment  Advice Given:  N/A  Office Follow Up:  Does the office need to follow up with this patient?: Yes  Instructions For The Office: Unable to Triage as patient has Appointment for follow up labwork at 11:15.  Please follow up if appointment if advised since patient is still symptomatic with nausea, headache, and lower back ache that has improved but not completely resolved.  RN Note:  Nausea with no vomiting, still has lower back ache, and has headache.  Last blood pressure 145/80.  States that he is also getting over a cold.  Has an appointment at 11:15 for follow up labs so was unable to completely triage symptoms because patient has to leave for appointment.  Please follow up with patient if re-evaluation today is recommended or if provider would like to wait until after results of blood work have been obtained.

## 2012-03-22 NOTE — Telephone Encounter (Signed)
Happy to see tomorrow if not feeling well.  BMP drawn today.

## 2012-03-23 NOTE — Telephone Encounter (Signed)
I called pt with lab results and he has been scheduled for next week follow-up, pt reports feeling stronger today, nausea improved

## 2012-03-23 NOTE — Progress Notes (Signed)
Quick Note:  Pt informed and reports feeling better, appt scheduled for next week ______

## 2012-03-30 ENCOUNTER — Ambulatory Visit (INDEPENDENT_AMBULATORY_CARE_PROVIDER_SITE_OTHER): Payer: BC Managed Care – PPO | Admitting: Family Medicine

## 2012-03-30 ENCOUNTER — Encounter: Payer: Self-pay | Admitting: Family Medicine

## 2012-03-30 VITALS — BP 160/90 | Temp 98.9°F | Wt 245.0 lb

## 2012-03-30 DIAGNOSIS — R799 Abnormal finding of blood chemistry, unspecified: Secondary | ICD-10-CM

## 2012-03-30 DIAGNOSIS — R7989 Other specified abnormal findings of blood chemistry: Secondary | ICD-10-CM

## 2012-03-30 NOTE — Patient Instructions (Addendum)
Continue to hold Diovan until we notify you.

## 2012-03-30 NOTE — Progress Notes (Signed)
  Subjective:    Patient ID: Rodney Hughes, male    DOB: [birth year], 61 y.o.   MRN: [id number]  HPI  Refer to recent note. Patient had trip to [place] and developed some nausea and vomiting. Question of GI infection versus kidney stone. Presented here with generalized weakness and fatigue and lab work significant for normal white blood count and creatinine 1.9. Patient was on Diovan among other medications and we told him to hold all nonsteroidals and Diovan suspecting that he may have had acute bump in creatinine from dehydration and ARB therapy.  Overall, he feels much better this time. His appetite has returned. He has no generalized weakness. No abdominal pain. No nausea or vomiting. He remains on amlodipine and metoprolol. Not taking any nonsteroidals   Review of Systems  Constitutional: Negative for fever, chills, appetite change and fatigue.  Respiratory: Negative for cough and shortness of breath.   Cardiovascular: Negative for chest pain.  Gastrointestinal: Negative for abdominal pain.       Objective:   Physical Exam  Constitutional: He appears well-developed and well-nourished.  Cardiovascular: Normal rate and regular rhythm.   Pulmonary/Chest: Effort normal and breath sounds normal. No respiratory distress. He has no wheezes. He has no rales.  Abdominal: Soft. There is no tenderness.          Assessment & Plan:  #1 recent symptoms of generalized weakness nausea and vomiting. Question acute infectious etiology with symptoms resolved at this time #2 acute renal insufficiency. Question related to dehydration in patient on angiotensin II receptor blocker therapy. Repeat basic metabolic panel today. If creatinine back to normal can reinitiate his Diovan therapy

## 2012-03-31 LAB — BASIC METABOLIC PANEL
CO2: 28 mEq/L (ref 19–32)
Calcium: 9.3 mg/dL (ref 8.4–10.5)
Chloride: 104 mEq/L (ref 96–112)
Creatinine, Ser: 1.2 mg/dL (ref 0.4–1.5)
Glucose, Bld: 96 mg/dL (ref 70–99)
Sodium: 141 mEq/L (ref 135–145)

## 2012-03-31 NOTE — Progress Notes (Signed)
Quick Note:  Pt informed and voiced understanding ______ 

## 2012-04-01 ENCOUNTER — Ambulatory Visit: Payer: 59 | Admitting: Pulmonary Disease

## 2012-04-05 ENCOUNTER — Encounter: Payer: Self-pay | Admitting: Pulmonary Disease

## 2012-04-05 ENCOUNTER — Ambulatory Visit (INDEPENDENT_AMBULATORY_CARE_PROVIDER_SITE_OTHER): Payer: BC Managed Care – PPO | Admitting: Pulmonary Disease

## 2012-04-05 VITALS — BP 148/88 | HR 70 | Temp 98.0°F | Ht 74.0 in | Wt 243.0 lb

## 2012-04-05 DIAGNOSIS — G4733 Obstructive sleep apnea (adult) (pediatric): Secondary | ICD-10-CM

## 2012-04-05 NOTE — Patient Instructions (Addendum)
Continue working on weight loss.  You are doing great. Keep up with supplies and mask changes followup with me in one year if doing well.,

## 2012-04-05 NOTE — Progress Notes (Signed)
  Subjective:    Patient ID: Rodney Hughes, male    DOB: 1951/01/18, 61 y.o.   MRN: [id number]  HPI Patient comes in today for followup of his obstructive sleep apnea.  He is wearing CPAP compliantly, and is having no issues with his mask fit or pressure.  He is sleeping well, and is satisfied with his daytime alertness.  He has lost 13 pounds since his last visit.   Review of Systems  Constitutional: Negative for fever and unexpected weight change.  HENT: Positive for congestion ( 2-3 weeks ago. ) and rhinorrhea. Negative for ear pain, nosebleeds, sore throat, sneezing, trouble swallowing, dental problem, postnasal drip and sinus pressure.   Eyes: Negative for redness and itching.  Respiratory: Negative for cough, chest tightness, shortness of breath and wheezing.   Cardiovascular: Negative for palpitations and leg swelling.  Gastrointestinal: Negative for nausea and vomiting.  Genitourinary: Negative for dysuria.  Musculoskeletal: Negative for joint swelling.  Skin: Negative for rash.  Neurological: Negative for headaches.  Hematological: Does not bruise/bleed easily.  Psychiatric/Behavioral: Negative for dysphoric mood. The patient is not nervous/anxious.        Objective:   Physical Exam Well-developed male in no acute distress No skin breakdown or pressure necrosis from the CPAP mask Neck without lymphadenopathy or thyromegaly Lower extremities without edema, cyanosis Alert and oriented, does not appear to be sleepy, moves all 4 extremities.       Assessment & Plan:

## 2012-04-05 NOTE — Assessment & Plan Note (Signed)
The patient is doing very well with CPAP, and feels that he is sleeping adequately with excellent daytime alertness.  I've asked him to continue on his CPAP, and to keep up with his mask changes and supplies.  He has lost weight since the last visit, and I have asked him to continue with this.  I will see him back in one year if doing well

## 2012-04-25 ENCOUNTER — Telehealth: Payer: Self-pay | Admitting: Family Medicine

## 2012-04-25 MED ORDER — VALSARTAN 320 MG PO TABS: 320.0000 mg | ORAL_TABLET | Freq: Every day | ORAL | Status: DC

## 2012-04-25 MED ORDER — METOPROLOL SUCCINATE ER 100 MG PO TB24: 100.0000 mg | ORAL_TABLET | Freq: Every day | ORAL | Status: DC

## 2012-04-25 MED ORDER — AMLODIPINE BESYLATE 10 MG PO TABS: 10.0000 mg | ORAL_TABLET | Freq: Every day | ORAL | Status: DC

## 2012-04-25 NOTE — Telephone Encounter (Signed)
Patient called stating that he need refills of his diovan 320mg  1poqd, metoprolol 100mg  1poqd and amlodipine 10mg  1poqd sent to a new pharmacy [facility] in [place]. Please assist.

## 2012-05-11 ENCOUNTER — Telehealth: Payer: Self-pay | Admitting: Family Medicine

## 2012-05-11 NOTE — Telephone Encounter (Signed)
Pt following up on valsartan (DIOVAN) 320 MG prior authorization. Thank you! [phone]

## 2012-05-12 NOTE — Telephone Encounter (Signed)
Still in processing w/[employer]. Pt is aware.

## 2012-05-16 ENCOUNTER — Telehealth: Payer: Self-pay | Admitting: Family Medicine

## 2012-05-16 NOTE — Telephone Encounter (Signed)
Dr. Caryl Never - the prior auth for Rodney Hughes's Diovan was denied. [employer] lists these as preferred: Diovan HCT, Atacand, Atacand HCT, Avalide, Avapro, Cozaar, Hyzaar, Teveten, Teveten HCT. Please advise. Thank you.

## 2012-05-16 NOTE — Telephone Encounter (Signed)
Let's try changing to Losartan 100 mg daily (make sure pt knows his insurance denied the Diovan).

## 2012-05-17 MED ORDER — LOSARTAN POTASSIUM 100 MG PO TABS: 100.0000 mg | ORAL_TABLET | Freq: Every day | ORAL | Status: DC

## 2012-05-17 MED ORDER — INDOMETHACIN 50 MG PO CAPS: 50.0000 mg | ORAL_CAPSULE | Freq: Three times a day (TID) | ORAL | Status: DC | PRN

## 2012-05-17 NOTE — Telephone Encounter (Signed)
Pt informed, also he requested refill indocin for prn use gout

## 2012-05-17 NOTE — Telephone Encounter (Signed)
Rodney Hughes - insurance notifies patient of denial. Please see note below to send in new rx for losartan.

## 2012-07-27 ENCOUNTER — Other Ambulatory Visit: Payer: Self-pay | Admitting: Family Medicine

## 2013-04-24 ENCOUNTER — Other Ambulatory Visit: Payer: Self-pay | Admitting: Family Medicine

## 2013-05-15 ENCOUNTER — Encounter: Payer: Self-pay | Admitting: Family Medicine

## 2013-05-15 ENCOUNTER — Ambulatory Visit (INDEPENDENT_AMBULATORY_CARE_PROVIDER_SITE_OTHER): Payer: BC Managed Care – PPO | Admitting: Family Medicine

## 2013-05-15 VITALS — BP 132/80 | HR 69 | Temp 97.8°F | Ht 74.0 in | Wt 261.0 lb

## 2013-05-15 DIAGNOSIS — E669 Obesity, unspecified: Secondary | ICD-10-CM | POA: Insufficient documentation

## 2013-05-15 DIAGNOSIS — Z0000 Encounter for general adult medical examination without abnormal findings: Secondary | ICD-10-CM

## 2013-05-15 MED ORDER — METOPROLOL SUCCINATE ER 100 MG PO TB24: 100.0000 mg | ORAL_TABLET | Freq: Every day | ORAL | Status: DC

## 2013-05-15 MED ORDER — LOSARTAN POTASSIUM 100 MG PO TABS
100.0000 mg | ORAL_TABLET | Freq: Every day | ORAL | Status: DC
Start: 2013-05-15 — End: 2014-06-01

## 2013-05-15 MED ORDER — AMLODIPINE BESYLATE 10 MG PO TABS
10.0000 mg | ORAL_TABLET | Freq: Every day | ORAL | Status: DC
Start: 2013-05-15 — End: 2014-05-30

## 2013-05-15 NOTE — Progress Notes (Signed)
Pre visit review using our clinic review tool, if applicable. No additional management support is needed unless otherwise documented below in the visit note. 

## 2013-05-15 NOTE — Patient Instructions (Signed)
Check on insurance coverage for shingles vaccine. 

## 2013-05-15 NOTE — Progress Notes (Signed)
   Subjective:    Patient ID: Rodney Hughes, male    DOB: Nov 03, 1950, 63 y.o.   MRN: [id number]  HPI Patient seen for complete physical. Has history of gout and hypertension. He is currently on losartan and has had no gout flareups since being on this and off of HCTZ. He has not had shingles vaccine. He declines flu vaccine. Colonoscopy up to date. 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 No recent chest pains. No dizziness.  Past Medical History  Diagnosis Date  . Gout   . Hypertension   . OSA (obstructive sleep apnea)    Past Surgical History  Procedure Laterality Date  . Polypectomy    . Tonsillectomy    . Knee surgery      reports that he has never smoked. He does not have any smokeless tobacco history on file. He reports that he drinks alcohol. His drug history is not on file. family history includes Allergies in his father; Diabetes in his father; Hypertension in his father. No Known Allergies    Review of Systems  Constitutional: Negative for fever, activity change, appetite change and fatigue.  HENT: Negative for congestion, ear pain and trouble swallowing.   Eyes: Negative for pain and visual disturbance.  Respiratory: Negative for cough, shortness of breath and wheezing.   Cardiovascular: Negative for chest pain and palpitations.  Gastrointestinal: Negative for nausea, vomiting, abdominal pain, diarrhea, constipation, blood in stool, abdominal distention and rectal pain.  Genitourinary: Negative for dysuria, hematuria and testicular pain.  Musculoskeletal: Negative for arthralgias and joint swelling.  Skin: Negative for rash.  Neurological: Negative for dizziness, syncope and headaches.  Hematological: Negative for adenopathy.  Psychiatric/Behavioral: Negative for confusion and dysphoric mood.       Objective:   Physical Exam  Constitutional: He is oriented to person, place, and time. He appears well-developed and well-nourished. No distress.    HENT:  Head: Normocephalic and atraumatic.  Right Ear: External ear normal.  Left Ear: External ear normal.  Mouth/Throat: Oropharynx is clear and moist.  Eyes: Conjunctivae and EOM are normal. Pupils are equal, round, and reactive to light.  Neck: Normal range of motion. Neck supple. No thyromegaly present.  Cardiovascular: Normal rate, regular rhythm and normal heart sounds.   No murmur heard. Pulmonary/Chest: No respiratory distress. He has no wheezes. He has no rales.  Abdominal: Soft. Bowel sounds are normal. He exhibits no distension and no mass. There is no tenderness. There is no rebound and no guarding.  Genitourinary: Rectum normal and prostate normal.  Musculoskeletal: He exhibits no edema.  Lymphadenopathy:    He has no cervical adenopathy.  Neurological: He is alert and oriented to person, place, and time. He displays normal reflexes. No cranial nerve deficit.  Skin: No rash noted.  Psychiatric: He has a normal mood and affect.          Assessment & Plan:  Complete physical. He is encouraged to lose some weight. Ordered fasting labs which are scheduled for later in the week. Check on insurance coverage for shingles vaccine. Flu vaccine declined. Blood pressure medications refilled for one year.

## 2013-05-18 ENCOUNTER — Other Ambulatory Visit: Payer: Self-pay

## 2013-05-18 ENCOUNTER — Other Ambulatory Visit (INDEPENDENT_AMBULATORY_CARE_PROVIDER_SITE_OTHER): Payer: BC Managed Care – PPO

## 2013-05-18 DIAGNOSIS — Z0000 Encounter for general adult medical examination without abnormal findings: Secondary | ICD-10-CM

## 2013-05-18 LAB — TSH: TSH: 2.28 u[IU]/mL (ref 0.35–5.50)

## 2013-05-18 LAB — BASIC METABOLIC PANEL
BUN: 14 mg/dL (ref 6–23)
CO2: 30 meq/L (ref 19–32)
Calcium: 9.4 mg/dL (ref 8.4–10.5)
Chloride: 103 mEq/L (ref 96–112)
Creatinine, Ser: 0.9 mg/dL (ref 0.4–1.5)
GFR: 90.28 mL/min (ref >60.00)
GLUCOSE: 114 mg/dL — AB (ref 70–99)
POTASSIUM: 4.2 meq/L (ref 3.5–5.1)
SODIUM: 142 meq/L (ref 135–145)

## 2013-05-18 LAB — CBC WITH DIFFERENTIAL/PLATELET
BASOS PCT: 0.3 % (ref 0.0–3.0)
Basophils Absolute: 0 10*3/uL (ref 0.0–0.1)
Eosinophils Absolute: 0.1 10*3/uL (ref 0.0–0.7)
Eosinophils Relative: 1.9 % (ref 0.0–5.0)
HCT: 40.8 % (ref 39.0–52.0)
Hemoglobin: 13.9 g/dL (ref 13.0–17.0)
LYMPHS PCT: 26 % (ref 12.0–46.0)
Lymphs Abs: 1.6 10*3/uL (ref 0.7–4.0)
MCHC: 34.2 g/dL (ref 30.0–36.0)
MCV: 84.9 fl (ref 78.0–100.0)
MONO ABS: 0.4 10*3/uL (ref 0.1–1.0)
Monocytes Relative: 6.4 % (ref 3.0–12.0)
NEUTROS PCT: 65.4 % (ref 43.0–77.0)
Neutro Abs: 4.1 10*3/uL (ref 1.4–7.7)
PLATELETS: 139 10*3/uL — AB (ref 150.0–400.0)
RBC: 4.8 Mil/uL (ref 4.22–5.81)
RDW: 12.7 % (ref 11.5–14.6)
WBC: 6.3 10*3/uL (ref 4.5–10.5)

## 2013-05-18 LAB — LIPID PANEL
Cholesterol: 204 mg/dL — ABNORMAL HIGH (ref 0–200)
HDL: 44.5 mg/dL (ref >39.00)
TRIGLYCERIDES: 164 mg/dL — AB (ref 0.0–149.0)
Total CHOL/HDL Ratio: 5
VLDL: 32.8 mg/dL (ref 0.0–40.0)

## 2013-05-18 LAB — HEPATIC FUNCTION PANEL
ALT: 20 U/L (ref 0–53)
AST: 24 U/L (ref 0–37)
Albumin: 4.3 g/dL (ref 3.5–5.2)
Alkaline Phosphatase: 46 U/L (ref 39–117)
Bilirubin, Direct: 0.1 mg/dL (ref 0.0–0.3)
TOTAL PROTEIN: 7.1 g/dL (ref 6.0–8.3)
Total Bilirubin: 0.8 mg/dL (ref 0.3–1.2)

## 2013-05-18 LAB — PSA: PSA: 4.5 ng/mL — ABNORMAL HIGH (ref 0.10–4.00)

## 2013-05-18 LAB — LDL CHOLESTEROL, DIRECT: LDL DIRECT: 137.9 mg/dL

## 2013-05-19 ENCOUNTER — Other Ambulatory Visit: Payer: Self-pay | Admitting: Family Medicine

## 2013-05-19 DIAGNOSIS — R972 Elevated prostate specific antigen [PSA]: Secondary | ICD-10-CM

## 2013-05-24 ENCOUNTER — Ambulatory Visit: Payer: BC Managed Care – PPO | Admitting: Pulmonary Disease

## 2013-06-14 ENCOUNTER — Ambulatory Visit: Payer: BC Managed Care – PPO | Admitting: Pulmonary Disease

## 2013-07-10 ENCOUNTER — Ambulatory Visit (INDEPENDENT_AMBULATORY_CARE_PROVIDER_SITE_OTHER): Payer: BC Managed Care – PPO | Admitting: Pulmonary Disease

## 2013-07-10 ENCOUNTER — Encounter: Payer: Self-pay | Admitting: Pulmonary Disease

## 2013-07-10 VITALS — BP 138/88 | HR 63 | Temp 98.1°F | Ht 74.0 in | Wt 262.2 lb

## 2013-07-10 DIAGNOSIS — G4733 Obstructive sleep apnea (adult) (pediatric): Secondary | ICD-10-CM

## 2013-07-10 NOTE — Patient Instructions (Signed)
Will leave your device on the auto setting since you are doing so well. Work on weight loss, and keep up with mask cushion changes and supplies. followup with me again in one year.

## 2013-07-10 NOTE — Progress Notes (Signed)
   Subjective:    Patient ID: Rodney Hughes, male    DOB: [birth year], 63 y.o.   MRN: [id number]  HPI The pt comes in today for f/u of his known osa.  He is wearing cpap compliantly, and has no issues with mask fit or pressure.  He elected to stay on auto setting, and feels he sleeps well.  Denies daytime sleepiness. His weight has increased 20 pounds since the last visit.    Review of Systems  Constitutional: Negative for fever and unexpected weight change.  HENT: Negative for congestion, dental problem, ear pain, nosebleeds, postnasal drip, rhinorrhea, sinus pressure, sneezing, sore throat and trouble swallowing.   Eyes: Negative for redness and itching.  Respiratory: Negative for cough, chest tightness, shortness of breath and wheezing.   Cardiovascular: Negative for palpitations and leg swelling.  Gastrointestinal: Negative for nausea and vomiting.  Genitourinary: Negative for dysuria.  Musculoskeletal: Negative for joint swelling.  Skin: Negative for rash.  Neurological: Negative for headaches.  Hematological: Does not bruise/bleed easily.  Psychiatric/Behavioral: Negative for dysphoric mood. The patient is not nervous/anxious.        Objective:   Physical Exam Well developed male in nad Nose without purulence or d/c noted. No skin breakdown or pressure necrosis from the cpap mask. Neck without LN or TMG LE without edema, no cyanosis Alert, does not appear to be sleepy, moves all 4.        Assessment & Plan:

## 2013-07-10 NOTE — Assessment & Plan Note (Signed)
The patient is doing very well on CPAP on the auto setting. He is satisfied with his sleep and daytime alertness, however has gained 20 pounds since the last visit. I've encouraged him to work aggressively on weight loss, and also to keep up with his mask changes and supplies. He will followup with me in one year if doing well.

## 2013-09-04 ENCOUNTER — Other Ambulatory Visit: Payer: Self-pay | Admitting: Family Medicine

## 2014-01-08 ENCOUNTER — Other Ambulatory Visit (HOSPITAL_COMMUNITY): Payer: Self-pay | Admitting: Urology

## 2014-01-08 DIAGNOSIS — C61 Malignant neoplasm of prostate: Secondary | ICD-10-CM

## 2014-01-19 ENCOUNTER — Ambulatory Visit (HOSPITAL_COMMUNITY)
Admission: RE | Admit: 2014-01-19 | Discharge: 2014-01-19 | Disposition: A | Discharge status: Home / Self Care | Payer: BC Managed Care – PPO | Source: Ambulatory Visit | Attending: Urology | Admitting: Urology

## 2014-01-19 DIAGNOSIS — C61 Malignant neoplasm of prostate: Secondary | ICD-10-CM | POA: Insufficient documentation

## 2014-01-19 LAB — POCT I-STAT CREATININE: Creatinine, Ser: 1 mg/dL (ref 0.50–1.35)

## 2014-01-19 MED ORDER — GADOBENATE DIMEGLUMINE 529 MG/ML IV SOLN
20.0000 mL | Freq: Once | INTRAVENOUS | Status: AC | PRN
  Administered 2014-01-19: 20 mL via INTRAVENOUS

## 2014-05-23 ENCOUNTER — Other Ambulatory Visit: Payer: Self-pay | Admitting: Family Medicine

## 2014-05-30 ENCOUNTER — Telehealth: Payer: Self-pay | Admitting: Family Medicine

## 2014-05-30 MED ORDER — AMLODIPINE BESYLATE 10 MG PO TABS: 10.0000 mg | ORAL_TABLET | Freq: Every day | ORAL | Status: DC

## 2014-05-30 MED ORDER — METOPROLOL SUCCINATE ER 100 MG PO TB24: 100.0000 mg | ORAL_TABLET | Freq: Every day | ORAL | Status: DC

## 2014-05-30 NOTE — Telephone Encounter (Signed)
Pt request refill of the following: amLODipine (NORVASC) 10 MG tablet, losartan (COZAAR) 100 MG tablet,   metoprolol succinate (TOPROL-XL) 100 MG 24 hr tablet  Pharmacy said they do not have any of these and it showing they were sent on 05/15/14   Pt also is requesting a referral to see Dr Gwenette Greet      Phamacy: [facility]

## 2014-05-30 NOTE — Telephone Encounter (Signed)
What is the question?  Looks like these Rxs were already sent.

## 2014-05-30 NOTE — Telephone Encounter (Signed)
Received call from office manager at [facility] who states she spoke with the patient's wife and pt still has not received refills for his losartan (COZAAR) 100 MG tablet and indomethacin (INDOCIN) 50 MG capsule.  Please send to [facility] in [place].  Informed York Cerise we would send to pharmacy but pt needs to contact office to schedule an appt as he as not been seen since January 2015.  She will notify pt to contact office for appt.  Also, previous request was sent for referral to see Dr. Gwenette Greet and it appears pt already has an appt scheduled to see Dr. Gwenette Greet on 06/26/14.

## 2014-05-30 NOTE — Telephone Encounter (Signed)
Rx sent to pharmacy. Pt wants a referral to see Dr. Gwenette Greet

## 2014-06-01 MED ORDER — INDOMETHACIN 50 MG PO CAPS: 50.0000 mg | ORAL_CAPSULE | Freq: Three times a day (TID) | ORAL | Status: DC | PRN

## 2014-06-01 MED ORDER — LOSARTAN POTASSIUM 100 MG PO TABS: 100.0000 mg | ORAL_TABLET | Freq: Every day | ORAL | Status: DC

## 2014-06-01 NOTE — Telephone Encounter (Signed)
Done. cpe scheduled

## 2014-06-01 NOTE — Telephone Encounter (Signed)
Rx's sent to pharmacy. Can you please call patient and set up appointment to be seen.

## 2014-06-01 NOTE — Telephone Encounter (Signed)
Only the amlodipine and metoprolol were sent in on 05/30/14.  Pt still needs rx for losartan and indomethacin that were also requested on the original refill  request from the pharmacy but were not approved to be refilled.  Please send to [facility].

## 2014-06-11 ENCOUNTER — Other Ambulatory Visit: Payer: Self-pay

## 2014-06-12 ENCOUNTER — Other Ambulatory Visit (INDEPENDENT_AMBULATORY_CARE_PROVIDER_SITE_OTHER): Payer: 59

## 2014-06-12 DIAGNOSIS — Z0000 Encounter for general adult medical examination without abnormal findings: Secondary | ICD-10-CM

## 2014-06-12 LAB — PSA: PSA: 4.61 ng/mL — ABNORMAL HIGH (ref 0.10–4.00)

## 2014-06-12 LAB — CBC WITH DIFFERENTIAL/PLATELET
Basophils Absolute: 0 10*3/uL (ref 0.0–0.1)
Basophils Relative: 0.3 % (ref 0.0–3.0)
Eosinophils Absolute: 0.1 10*3/uL (ref 0.0–0.7)
Eosinophils Relative: 0.6 % (ref 0.0–5.0)
HCT: 39.8 % (ref 39.0–52.0)
Hemoglobin: 13.4 g/dL (ref 13.0–17.0)
Lymphocytes Relative: 18.3 % (ref 12.0–46.0)
Lymphs Abs: 1.7 10*3/uL (ref 0.7–4.0)
MCHC: 33.6 g/dL (ref 30.0–36.0)
MCV: 84.2 fl (ref 78.0–100.0)
Monocytes Absolute: 0.6 10*3/uL (ref 0.1–1.0)
Monocytes Relative: 6.5 % (ref 3.0–12.0)
Neutro Abs: 6.9 10*3/uL (ref 1.4–7.7)
Neutrophils Relative %: 74.3 % (ref 43.0–77.0)
Platelets: 187 10*3/uL (ref 150.0–400.0)
RBC: 4.73 Mil/uL (ref 4.22–5.81)
RDW: 13 % (ref 11.5–15.5)
WBC: 9.2 10*3/uL (ref 4.0–10.5)

## 2014-06-12 LAB — TSH: TSH: 2.96 u[IU]/mL (ref 0.35–4.50)

## 2014-06-12 LAB — BASIC METABOLIC PANEL
BUN: 16 mg/dL (ref 6–23)
CHLORIDE: 104 meq/L (ref 96–112)
CO2: 32 mEq/L (ref 19–32)
Calcium: 9.4 mg/dL (ref 8.4–10.5)
Creatinine, Ser: 0.97 mg/dL (ref 0.40–1.50)
GFR: 83.57 mL/min (ref >60.00)
Glucose, Bld: 122 mg/dL — ABNORMAL HIGH (ref 70–99)
Potassium: 4.8 mEq/L (ref 3.5–5.1)
SODIUM: 141 meq/L (ref 135–145)

## 2014-06-12 LAB — HEPATIC FUNCTION PANEL
ALT: 18 U/L (ref 0–53)
AST: 16 U/L (ref 0–37)
Albumin: 4.2 g/dL (ref 3.5–5.2)
Alkaline Phosphatase: 57 U/L (ref 39–117)
Bilirubin, Direct: 0.1 mg/dL (ref 0.0–0.3)
Total Bilirubin: 0.7 mg/dL (ref 0.2–1.2)
Total Protein: 7 g/dL (ref 6.0–8.3)

## 2014-06-12 LAB — LIPID PANEL
CHOLESTEROL: 177 mg/dL (ref 0–200)
HDL: 36.2 mg/dL — ABNORMAL LOW (ref >39.00)
LDL Cholesterol: 123 mg/dL — ABNORMAL HIGH (ref 0–99)
NonHDL: 140.8
TRIGLYCERIDES: 89 mg/dL (ref 0.0–149.0)
Total CHOL/HDL Ratio: 5
VLDL: 17.8 mg/dL (ref 0.0–40.0)

## 2014-06-18 ENCOUNTER — Ambulatory Visit (INDEPENDENT_AMBULATORY_CARE_PROVIDER_SITE_OTHER): Payer: 59 | Admitting: Family Medicine

## 2014-06-18 ENCOUNTER — Encounter: Payer: Self-pay | Admitting: Family Medicine

## 2014-06-18 VITALS — BP 140/90 | HR 63 | Temp 98.1°F | Ht 74.0 in | Wt 243.0 lb

## 2014-06-18 DIAGNOSIS — I10 Essential (primary) hypertension: Secondary | ICD-10-CM

## 2014-06-18 DIAGNOSIS — Z0000 Encounter for general adult medical examination without abnormal findings: Secondary | ICD-10-CM

## 2014-06-18 DIAGNOSIS — R7303 Prediabetes: Secondary | ICD-10-CM

## 2014-06-18 DIAGNOSIS — R7309 Other abnormal glucose: Secondary | ICD-10-CM

## 2014-06-18 NOTE — Progress Notes (Signed)
Pre visit review using our clinic review tool, if applicable. No additional management support is needed unless otherwise documented below in the visit note. 

## 2014-06-18 NOTE — Progress Notes (Signed)
   Subjective:    Patient ID: Rodney Hughes, male    DOB: [birth year], 64 y.o.   MRN: [id number]  HPI Patient here for complete physical. He has history of obesity, gout, hypertension, obstructive sleep apnea, chronic elevated PSA. His followed regularly by urology and has follow-up with them in one month. No history of shingles vaccine. He will be due for repeat colonoscopy later this year. Medications reviewed. Compliant with all. Poor compliance with diet over the past several months but over the past month he has already lost about 7 pounds due to his efforts is trying to increase his activity. No chest pains, headaches, or dizziness. Tetanus is up-to-date  Past Medical History  Diagnosis Date  . Gout   . Hypertension   . OSA (obstructive sleep apnea)    Past Surgical History  Procedure Laterality Date  . Polypectomy    . Tonsillectomy    . Knee surgery      reports that he has never smoked. He does not have any smokeless tobacco history on file. He reports that he drinks alcohol. His drug history is not on file. family history includes Allergies in his father; Diabetes in his father; Hypertension in his father. No Known Allergies    Review of Systems  Constitutional: Negative for fever, activity change, appetite change, fatigue and unexpected weight change.  HENT: Negative for congestion, ear pain and trouble swallowing.   Eyes: Negative for pain and visual disturbance.  Respiratory: Negative for cough, chest tightness, shortness of breath and wheezing.   Cardiovascular: Negative for chest pain, palpitations and leg swelling.  Gastrointestinal: Negative for nausea, vomiting, abdominal pain, diarrhea, constipation, blood in stool, abdominal distention and rectal pain.  Endocrine: Negative for polydipsia and polyuria.  Genitourinary: Negative for dysuria, hematuria and testicular pain.  Musculoskeletal: Negative for joint swelling.  Skin: Negative for rash.  Neurological:  Negative for dizziness, syncope, weakness, light-headedness and headaches.  Hematological: Negative for adenopathy.  Psychiatric/Behavioral: Negative for confusion and dysphoric mood.       Objective:   Physical Exam  Constitutional: He is oriented to person, place, and time. He appears well-developed and well-nourished. No distress.  HENT:  Head: Normocephalic and atraumatic.  Right Ear: External ear normal.  Left Ear: External ear normal.  Mouth/Throat: Oropharynx is clear and moist.  Eyes: Conjunctivae and EOM are normal. Pupils are equal, round, and reactive to light.  Neck: Normal range of motion. Neck supple. No thyromegaly present.  Cardiovascular: Normal rate, regular rhythm and normal heart sounds.   No murmur heard. Pulmonary/Chest: No respiratory distress. He has no wheezes. He has no rales.  Abdominal: Soft. Bowel sounds are normal. He exhibits no distension and no mass. There is no tenderness. There is no rebound and no guarding.  Genitourinary:  Per urology whom he sees next month  Musculoskeletal: He exhibits no edema.  Lymphadenopathy:    He has no cervical adenopathy.  Neurological: He is alert and oriented to person, place, and time. He displays normal reflexes. No cranial nerve deficit.  Skin: No rash noted.  Psychiatric: He has a normal mood and affect.          Assessment & Plan:  Complete physical. Labs reviewed. He has prediabetes. No other major abnormalities. We discussed shingles vaccine and he will check on insurance coverage. Tetanus up-to-date. Continue yearly flu vaccine. He will be due for repeat colonoscopy later this year

## 2014-06-18 NOTE — Patient Instructions (Signed)
Check on coverage for Shingles vaccine and let us know if interested.

## 2014-06-19 ENCOUNTER — Telehealth: Payer: Self-pay | Admitting: Family Medicine

## 2014-06-19 NOTE — Telephone Encounter (Signed)
emmi mailed  °

## 2014-06-26 ENCOUNTER — Ambulatory Visit (INDEPENDENT_AMBULATORY_CARE_PROVIDER_SITE_OTHER): Payer: 59 | Admitting: Pulmonary Disease

## 2014-06-26 ENCOUNTER — Encounter: Payer: Self-pay | Admitting: Pulmonary Disease

## 2014-06-26 VITALS — BP 138/70 | HR 61 | Temp 97.0°F | Ht 74.0 in | Wt 238.8 lb

## 2014-06-26 DIAGNOSIS — G4733 Obstructive sleep apnea (adult) (pediatric): Secondary | ICD-10-CM

## 2014-06-26 NOTE — Assessment & Plan Note (Signed)
The patient continues to do well on C Pap overall, and has made a lifestyle change that is resulting in slow weight loss. I have asked him to keep up with his mask changes and supplies, and to continue working on his weight reduction. He is to call me if he is able to get his weight below 200 pounds.

## 2014-06-26 NOTE — Patient Instructions (Signed)
Continue on cpap, and keep up with mask changes and supplies. Work on continued weight loss.  You are doing well.  Goal is less than 200 followup with me again in one year.

## 2014-06-26 NOTE — Progress Notes (Signed)
   Subjective:    Patient ID: Rodney Hughes, male    DOB: [birth year], 64 y.o.   MRN: [id number]  HPI The patient comes in today for follow-up of his obstructive sleep apnea. He is wearing C Pap compliantly now that he has gotten over his bad chest and head cold, and feels that he is doing well with the device. He is satisfied with his sleep and daytime alertness. He has lost significant weight since the last visit, and tells me that he and his wife have made a dramatic lifestyle change. He is having no mask fit issues or pressure issues.   Review of Systems  Constitutional: Negative for fever and unexpected weight change.  HENT: Positive for congestion and postnasal drip. Negative for dental problem, ear pain, nosebleeds, rhinorrhea, sinus pressure, sneezing, sore throat and trouble swallowing.   Eyes: Negative for redness and itching.  Respiratory: Positive for cough. Negative for chest tightness, shortness of breath and wheezing.   Cardiovascular: Negative for palpitations and leg swelling.  Gastrointestinal: Negative for nausea and vomiting.  Genitourinary: Negative for dysuria.  Musculoskeletal: Negative for joint swelling.  Skin: Negative for rash.  Neurological: Negative for headaches.  Hematological: Does not bruise/bleed easily.  Psychiatric/Behavioral: Negative for dysphoric mood. The patient is not nervous/anxious.        Objective:   Physical Exam Well-developed male in no acute distress Nose without purulence or discharge noted Neck without lymphadenopathy or thyromegaly No skin breakdown or pressure necrosis from the C Pap mask Lower extremities without edema, no cyanosis Alert and oriented, moves all 4 extremities.       Assessment & Plan:

## 2014-06-29 ENCOUNTER — Other Ambulatory Visit: Payer: Self-pay | Admitting: Family Medicine

## 2014-07-12 ENCOUNTER — Ambulatory Visit: Payer: BC Managed Care – PPO | Admitting: Pulmonary Disease

## 2014-12-17 ENCOUNTER — Other Ambulatory Visit: Payer: 59

## 2014-12-25 ENCOUNTER — Other Ambulatory Visit (INDEPENDENT_AMBULATORY_CARE_PROVIDER_SITE_OTHER): Payer: 59

## 2014-12-25 DIAGNOSIS — R7309 Other abnormal glucose: Secondary | ICD-10-CM

## 2014-12-25 DIAGNOSIS — R7303 Prediabetes: Secondary | ICD-10-CM

## 2014-12-25 LAB — HEMOGLOBIN A1C: Hgb A1c MFr Bld: 6.6 % — ABNORMAL HIGH (ref 4.6–6.5)

## 2014-12-26 ENCOUNTER — Other Ambulatory Visit: Payer: Self-pay | Admitting: Family Medicine

## 2015-03-01 ENCOUNTER — Encounter: Payer: Self-pay | Admitting: Gastroenterology

## 2015-05-22 ENCOUNTER — Other Ambulatory Visit: Payer: Self-pay | Admitting: Family Medicine

## 2015-06-03 ENCOUNTER — Encounter: Payer: Self-pay | Admitting: Gastroenterology

## 2015-06-27 DIAGNOSIS — E785 Hyperlipidemia, unspecified: Secondary | ICD-10-CM | POA: Insufficient documentation

## 2015-07-01 ENCOUNTER — Ambulatory Visit: Payer: 59 | Admitting: Pulmonary Disease

## 2015-07-16 ENCOUNTER — Ambulatory Visit (AMBULATORY_SURGERY_CENTER): Payer: Self-pay

## 2015-07-16 VITALS — Ht 74.0 in | Wt 243.8 lb

## 2015-07-16 DIAGNOSIS — Z86010 Personal history of colonic polyps: Secondary | ICD-10-CM

## 2015-07-16 MED ORDER — NA SULFATE-K SULFATE-MG SULF 17.5-3.13-1.6 GM/177ML PO SOLN: ORAL | Status: DC

## 2015-07-16 NOTE — Progress Notes (Signed)
Per pt, no allergies to soy or egg products.Pt not taking any weight loss meds or using  O2 at home. 

## 2015-07-19 ENCOUNTER — Telehealth: Payer: Self-pay | Admitting: Gastroenterology

## 2015-07-19 NOTE — Telephone Encounter (Signed)
Called pt back, LM to call me back today by 5 pm, sent staff message to Julieanne Cotton CMA to see if we had any samples of suprep-adm

## 2015-07-19 NOTE — Telephone Encounter (Signed)
Pt called back, pt was given miralax instructions over the phone by someone else who called earlier but no phone note left, pt was ok with staying with the instructions he was given-adm

## 2015-07-22 ENCOUNTER — Encounter: Payer: Self-pay | Admitting: Gastroenterology

## 2015-07-30 ENCOUNTER — Encounter: Payer: Self-pay | Admitting: Gastroenterology

## 2015-07-30 ENCOUNTER — Ambulatory Visit (AMBULATORY_SURGERY_CENTER): Payer: BLUE CROSS/BLUE SHIELD | Admitting: Gastroenterology

## 2015-07-30 VITALS — BP 119/76 | HR 50 | Temp 99.3°F | Resp 11 | Ht 74.0 in | Wt 243.0 lb

## 2015-07-30 DIAGNOSIS — D122 Benign neoplasm of ascending colon: Secondary | ICD-10-CM

## 2015-07-30 DIAGNOSIS — Z86010 Personal history of colonic polyps: Secondary | ICD-10-CM | POA: Diagnosis not present

## 2015-07-30 MED ORDER — SODIUM CHLORIDE 0.9 % IV SOLN: 500.0000 mL | INTRAVENOUS | Status: DC

## 2015-07-30 NOTE — Patient Instructions (Signed)
YOU HAD AN ENDOSCOPIC PROCEDURE TODAY AT THE [facility]:   Refer to the procedure report that was given to you for any specific questions about what was found during the examination.  If the procedure report does not answer your questions, please call your gastroenterologist to clarify.  If you requested that your care partner not be given the details of your procedure findings, then the procedure report has been included in a sealed envelope for you to review at your convenience later.  YOU SHOULD EXPECT: Some feelings of bloating in the abdomen. Passage of more gas than usual.  Walking can help get rid of the air that was put into your GI tract during the procedure and reduce the bloating. If you had a lower endoscopy (such as a colonoscopy or flexible sigmoidoscopy) you may notice spotting of blood in your stool or on the toilet paper. If you underwent a bowel prep for your procedure, you may not have a normal bowel movement for a few days.  Please Note:  You might notice some irritation and congestion in your nose or some drainage.  This is from the oxygen used during your procedure.  There is no need for concern and it should clear up in a day or so.  SYMPTOMS TO REPORT IMMEDIATELY:   Following lower endoscopy (colonoscopy or flexible sigmoidoscopy):  Excessive amounts of blood in the stool  Significant tenderness or worsening of abdominal pains  Swelling of the abdomen that is new, acute  Fever of 100F or higher   For urgent or emergent issues, a gastroenterologist can be reached at any hour by calling [phone].   DIET: Your first meal following the procedure should be a small meal and then it is ok to progress to your normal diet. Heavy or fried foods are harder to digest and may make you feel nauseous or bloated.  Likewise, meals heavy in dairy and vegetables can increase bloating.  Drink plenty of fluids but you should avoid alcoholic beverages for 24  hours.  ACTIVITY:  You should plan to take it easy for the rest of today and you should NOT DRIVE or use heavy machinery until tomorrow (because of the sedation medicines used during the test).    FOLLOW UP: Our staff will call the number listed on your records the next business day following your procedure to check on you and address any questions or concerns that you may have regarding the information given to you following your procedure. If we do not reach you, we will leave a message.  However, if you are feeling well and you are not experiencing any problems, there is no need to return our call.  We will assume that you have returned to your regular daily activities without incident.  If any biopsies were taken you will be contacted by phone or by letter within the next 1-3 weeks.  Please call us at [phone] if you have not heard about the biopsies in 3 weeks.    SIGNATURES/CONFIDENTIALITY: You and/or your care partner have signed paperwork which will be entered into your electronic medical record.  These signatures attest to the fact that that the information above on your After Visit Summary has been reviewed and is understood.  Full responsibility of the confidentiality of this discharge information lies with you and/or your care-partner.    Resume medications. Information given on polyps. 

## 2015-07-30 NOTE — Op Note (Signed)
[place] Patient Name: Octavia Bronkema Procedure Date: 07/30/2015 10:07 AM MRN: [id number] Endoscopist: Milus Banister , MD Age: 65 Referring MD:  Date of Birth: 1950-08-02 Gender: Male Procedure:                Colonoscopy Indications:              High risk colon cancer surveillance: Personal                            history of colonic polyps (sub CM TA removed Dr.                            Ardis Hughs 2011; remote colonoscopies, one in 1998 with                            rectal polyp with "severe dysplasia" per outside                            pathology. Medicines:                Monitored Anesthesia Care Procedure:                Pre-Anesthesia Assessment:                           - Prior to the procedure, a History and Physical                            was performed, and patient medications and                            allergies were reviewed. The patient's tolerance of                            previous anesthesia was also reviewed. The risks                            and benefits of the procedure and the sedation                            options and risks were discussed with the patient.                            All questions were answered, and informed consent                            was obtained. Prior Anticoagulants: The patient has                            taken no previous anticoagulant or antiplatelet                            agents. ASA Grade Assessment: II - A patient with  mild systemic disease. After reviewing the risks                            and benefits, the patient was deemed in                            satisfactory condition to undergo the procedure.                           After obtaining informed consent, the colonoscope                            was passed under direct vision. Throughout the                            procedure, the patient's blood pressure, pulse, and   oxygen saturations were monitored continuously. The                            Model CF-HQ190L [id number]) scope was introduced                            through the anus and advanced to the the cecum,                            identified by appendiceal orifice and ileocecal                            valve. The colonoscopy was performed without                            difficulty. The patient tolerated the procedure                            well. The quality of the bowel preparation was                            good. The ileocecal valve, appendiceal orifice, and                            rectum were photographed. Scope In: 10:22:00 AM Scope Out: 10:32:21 AM Scope Withdrawal Time: 0 hours 8 minutes 12 seconds  Total Procedure Duration: 0 hours 10 minutes 21 seconds  Findings:      A 6 mm polyp was found in the ascending colon. The polyp was sessile.       The polyp was removed with a cold snare. Resection and retrieval were       complete.      The exam was otherwise without abnormality on direct and retroflexion       views. Complications:            No immediate complications. Estimated blood loss:                            None. Estimated Blood Loss:     Estimated blood loss:  none. Impression:               - One 6 mm polyp in the ascending colon, removed                            with a cold snare. Resected and retrieved.                           - The examination was otherwise normal on direct                            and retroflexion views. Recommendation:           - Patient has a contact number available for                            emergencies. The signs and symptoms of potential                            delayed complications were discussed with the                            patient. Return to normal activities tomorrow.                            Written discharge instructions were provided to the                            patient.                            - Resume previous diet.                           - Continue present medications.                           You will receive a letter within 2-3 weeks with the                            pathology results and my final recommendations.                           If the polyp(s) is proven to be 'pre-cancerous' on                            pathology, you will need repeat colonoscopy in 5                            years without need for colon cancer screening by                            any method prior to then (including stool testing). Milus Banister, MD 07/30/2015 10:36:26 AM This report has been signed electronically. Number of Addenda: 0 Referring MD:      Darnell Level  Dan Europe

## 2015-07-30 NOTE — Progress Notes (Signed)
Patient awakening,vss,report to rn 

## 2015-07-30 NOTE — Progress Notes (Signed)
Called to room to assist during endoscopic procedure.  Patient ID and intended procedure confirmed with present staff. Received instructions for my participation in the procedure from the performing physician.  

## 2015-07-31 ENCOUNTER — Telehealth: Payer: Self-pay | Admitting: *Deleted

## 2015-07-31 NOTE — Telephone Encounter (Signed)
  Follow up Call-  Call back number 07/30/2015  Post procedure Call Back phone  # [phone]  Permission to leave phone message Yes     Patient questions:  Do you have a fever, pain , or abdominal swelling? No. Pain Score  0 *  Have you tolerated food without any problems? Yes.    Have you been able to return to your normal activities? Yes.    Do you have any questions about your discharge instructions: Diet   No. Medications  No. Follow up visit  No.  Do you have questions or concerns about your Care? No.  Actions: * If pain score is 4 or above: No action needed, pain <4.

## 2015-08-09 ENCOUNTER — Encounter: Payer: Self-pay | Admitting: Gastroenterology

## 2015-08-20 ENCOUNTER — Ambulatory Visit (INDEPENDENT_AMBULATORY_CARE_PROVIDER_SITE_OTHER): Payer: BLUE CROSS/BLUE SHIELD | Admitting: Pulmonary Disease

## 2015-08-20 ENCOUNTER — Encounter: Payer: Self-pay | Admitting: Pulmonary Disease

## 2015-08-20 VITALS — BP 142/82 | HR 58 | Ht 74.0 in | Wt 244.0 lb

## 2015-08-20 DIAGNOSIS — R05 Cough: Secondary | ICD-10-CM | POA: Diagnosis not present

## 2015-08-20 DIAGNOSIS — E669 Obesity, unspecified: Secondary | ICD-10-CM

## 2015-08-20 DIAGNOSIS — R059 Cough, unspecified: Secondary | ICD-10-CM | POA: Insufficient documentation

## 2015-08-20 DIAGNOSIS — G4733 Obstructive sleep apnea (adult) (pediatric): Secondary | ICD-10-CM | POA: Diagnosis not present

## 2015-08-20 NOTE — Patient Instructions (Signed)
1. Continue using cpap.  Return to clinic in 1 yr.

## 2015-08-20 NOTE — Assessment & Plan Note (Signed)
Weight reduction 

## 2015-08-20 NOTE — Progress Notes (Signed)
   Subjective:    Patient ID: Rodney Hughes, male    DOB: 05/21/1950, 65 y.o.   MRN: [id number]  HPI Pt is being seen for severe OSA.  ROV (08/20/15)  Pt returns to office as f/u on OSA. Since last seen, he has been doing well. Last DL until 03/2014 > 17%, AHI 0.4. Uses his cpap. Feels better using it. More energy. Less sleepiness. Feels benefit of using cpap.  Has not been admitted nor has been on abx recently. Recently dxed with preDM.   Review of Systems  Constitutional: Negative.        Lost 15 lbs x 2 mos  HENT: Negative.   Eyes: Negative.   Respiratory: Negative.   Cardiovascular: Negative.   Gastrointestinal: Negative.   Endocrine: Negative.   Genitourinary: Negative.   Musculoskeletal: Negative.   Skin: Negative.   Allergic/Immunologic: Negative.   Neurological: Negative.   Hematological: Negative.   Psychiatric/Behavioral: Negative.   All other systems reviewed and are negative.      Objective:   Physical Exam  Vitals:  Filed Vitals:   08/20/15 0903  BP: 142/82  Pulse: 58  Height: 6\' 2"  (1.88 m)  Weight: 244 lb (110.678 kg)  SpO2: 97%    Constitutional/General:  Pleasant, well-nourished, well-developed, not in any distress,  Comfortably seating.  Well kempt  Body mass index is 31.31 kg/(m^2). Wt Readings from Last 3 Encounters:  08/20/15 244 lb (110.678 kg)  07/30/15 243 lb (110.224 kg)  07/16/15 243 lb 12.8 oz (110.587 kg)    Neck circumference:   HEENT: Pupils equal and reactive to light and accommodation. Anicteric sclerae. Normal nasal mucosa.   No oral  lesions,  mouth clear,  oropharynx clear, no postnasal drip. (-) Oral thrush. No dental caries.  Airway - Mallampati class IV  Neck: No masses. Midline trachea. No JVD, (-) LAD. (-) bruits appreciated.  Respiratory/Chest: Grossly normal chest. (-) deformity. (-) Accessory muscle use.  Symmetric expansion. (-) Tenderness on palpation.  Resonant on percussion.  Diminished BS on both lower  lung zones. (-) wheezing, crackles, rhonchi (-) egophony  Cardiovascular: Regular rate and  rhythm, heart sounds normal, no murmur or gallops, no peripheral edema  Gastrointestinal:  Normal bowel sounds. Soft, non-tender. No hepatosplenomegaly.  (-) masses.   Musculoskeletal:  Normal muscle tone. Normal gait.   Extremities: Grossly normal. (-) clubbing, cyanosis.  (-) edema  Skin: (-) rash,lesions seen.   Neurological/Psychiatric : alert, oriented to time, place, person. Normal mood and affect       Assessment & Plan:  Obstructive sleep apnea NPSG 2002:  Severe osa Pressure optimized to 12cm on auto 2012.   Feels benefit of cpap. More energy. Less sleepiness.  Machine is still working great. Try to hold off on getting a new cpap until medicare age.  Pt will call if with cpap issues.   Obesity (BMI 30-39.9) Weight reduction  Cough 2/2 allergies. Continue Flonase when necessary.    Return to clinic in 1 yr.    Monica Becton, MD 08/20/2015, 9:28 AM  Pulmonary and Critical Care Pager [phone] After 3 pm or if no answer, call [phone]

## 2015-08-20 NOTE — Assessment & Plan Note (Signed)
2/2 allergies. Continue Flonase when necessary.

## 2015-08-20 NOTE — Assessment & Plan Note (Addendum)
NPSG 2002:  Severe osa Pressure optimized to 12cm on auto 2012.   Feels benefit of cpap. More energy. Less sleepiness.  Machine is still working great. Try to hold off on getting a new cpap until medicare age.  Pt will call if with cpap issues.

## 2015-08-30 ENCOUNTER — Other Ambulatory Visit: Payer: Self-pay | Admitting: Family Medicine

## 2017-05-24 ENCOUNTER — Other Ambulatory Visit: Payer: Self-pay | Admitting: Urology

## 2017-05-24 DIAGNOSIS — C61 Malignant neoplasm of prostate: Secondary | ICD-10-CM

## 2017-06-25 ENCOUNTER — Ambulatory Visit
Admission: RE | Admit: 2017-06-25 | Discharge: 2017-06-25 | Disposition: A | Discharge status: Home / Self Care | Payer: BLUE CROSS/BLUE SHIELD | Source: Ambulatory Visit | Attending: Urology | Admitting: Urology

## 2017-06-25 DIAGNOSIS — C61 Malignant neoplasm of prostate: Secondary | ICD-10-CM

## 2017-06-25 MED ORDER — GADOBENATE DIMEGLUMINE 529 MG/ML IV SOLN
20.0000 mL | Freq: Once | INTRAVENOUS | Status: AC | PRN
  Administered 2017-06-25: 20 mL via INTRAVENOUS

## 2017-06-29 ENCOUNTER — Other Ambulatory Visit: Payer: Self-pay | Admitting: Nurse Practitioner

## 2017-06-29 ENCOUNTER — Ambulatory Visit
Admission: RE | Admit: 2017-06-29 | Discharge: 2017-06-29 | Disposition: A | Discharge status: Home / Self Care | Payer: BLUE CROSS/BLUE SHIELD | Source: Ambulatory Visit | Attending: Nurse Practitioner | Admitting: Nurse Practitioner

## 2017-06-29 DIAGNOSIS — R059 Cough, unspecified: Secondary | ICD-10-CM

## 2017-06-29 DIAGNOSIS — R05 Cough: Secondary | ICD-10-CM

## 2018-07-12 DIAGNOSIS — C61 Malignant neoplasm of prostate: Secondary | ICD-10-CM | POA: Diagnosis not present

## 2018-07-26 DIAGNOSIS — C61 Malignant neoplasm of prostate: Secondary | ICD-10-CM | POA: Diagnosis not present

## 2018-08-16 DIAGNOSIS — G4733 Obstructive sleep apnea (adult) (pediatric): Secondary | ICD-10-CM | POA: Diagnosis not present

## 2018-08-16 DIAGNOSIS — E785 Hyperlipidemia, unspecified: Secondary | ICD-10-CM | POA: Diagnosis not present

## 2018-08-16 DIAGNOSIS — C801 Malignant (primary) neoplasm, unspecified: Secondary | ICD-10-CM | POA: Diagnosis not present

## 2018-08-16 DIAGNOSIS — E1169 Type 2 diabetes mellitus with other specified complication: Secondary | ICD-10-CM | POA: Diagnosis not present

## 2018-08-16 DIAGNOSIS — I10 Essential (primary) hypertension: Secondary | ICD-10-CM | POA: Diagnosis not present

## 2018-11-15 DIAGNOSIS — E1169 Type 2 diabetes mellitus with other specified complication: Secondary | ICD-10-CM | POA: Diagnosis not present

## 2018-11-15 DIAGNOSIS — Z0000 Encounter for general adult medical examination without abnormal findings: Secondary | ICD-10-CM | POA: Diagnosis not present

## 2018-11-15 DIAGNOSIS — I10 Essential (primary) hypertension: Secondary | ICD-10-CM | POA: Diagnosis not present

## 2018-11-15 DIAGNOSIS — E785 Hyperlipidemia, unspecified: Secondary | ICD-10-CM | POA: Diagnosis not present

## 2019-02-22 DIAGNOSIS — Z7984 Long term (current) use of oral hypoglycemic drugs: Secondary | ICD-10-CM | POA: Diagnosis not present

## 2019-02-22 DIAGNOSIS — E119 Type 2 diabetes mellitus without complications: Secondary | ICD-10-CM | POA: Diagnosis not present

## 2019-02-22 DIAGNOSIS — H2513 Age-related nuclear cataract, bilateral: Secondary | ICD-10-CM | POA: Diagnosis not present

## 2019-03-02 DIAGNOSIS — H52223 Regular astigmatism, bilateral: Secondary | ICD-10-CM | POA: Diagnosis not present

## 2019-03-02 DIAGNOSIS — H5203 Hypermetropia, bilateral: Secondary | ICD-10-CM | POA: Diagnosis not present

## 2019-03-07 DIAGNOSIS — C61 Malignant neoplasm of prostate: Secondary | ICD-10-CM | POA: Diagnosis not present

## 2019-03-13 DIAGNOSIS — C61 Malignant neoplasm of prostate: Secondary | ICD-10-CM | POA: Diagnosis not present

## 2019-05-26 DIAGNOSIS — M25561 Pain in right knee: Secondary | ICD-10-CM | POA: Diagnosis not present

## 2019-05-26 DIAGNOSIS — I10 Essential (primary) hypertension: Secondary | ICD-10-CM | POA: Diagnosis not present

## 2019-05-26 DIAGNOSIS — M25461 Effusion, right knee: Secondary | ICD-10-CM | POA: Diagnosis not present

## 2019-06-05 DIAGNOSIS — D075 Carcinoma in situ of prostate: Secondary | ICD-10-CM | POA: Diagnosis not present

## 2019-06-05 DIAGNOSIS — C61 Malignant neoplasm of prostate: Secondary | ICD-10-CM | POA: Diagnosis not present

## 2019-06-13 DIAGNOSIS — R972 Elevated prostate specific antigen [PSA]: Secondary | ICD-10-CM | POA: Diagnosis not present

## 2019-06-20 ENCOUNTER — Other Ambulatory Visit: Payer: Self-pay | Admitting: Urology

## 2019-06-26 DIAGNOSIS — M25461 Effusion, right knee: Secondary | ICD-10-CM | POA: Diagnosis not present

## 2019-06-26 DIAGNOSIS — C61 Malignant neoplasm of prostate: Secondary | ICD-10-CM | POA: Diagnosis not present

## 2019-06-26 DIAGNOSIS — I10 Essential (primary) hypertension: Secondary | ICD-10-CM | POA: Diagnosis not present

## 2019-06-26 DIAGNOSIS — E1169 Type 2 diabetes mellitus with other specified complication: Secondary | ICD-10-CM | POA: Diagnosis not present

## 2019-06-26 DIAGNOSIS — M25561 Pain in right knee: Secondary | ICD-10-CM | POA: Diagnosis not present

## 2019-06-26 DIAGNOSIS — E785 Hyperlipidemia, unspecified: Secondary | ICD-10-CM | POA: Diagnosis not present

## 2019-07-07 NOTE — Patient Instructions (Signed)
DUE TO COVID-19 ONLY ONE VISITOR IS ALLOWED TO COME WITH YOU AND STAY IN THE WAITING ROOM ONLY DURING PRE OP AND PROCEDURE DAY OF SURGERY. THE 1 VISITOR MAY VISIT WITH YOU AFTER SURGERY IN YOUR PRIVATE ROOM DURING VISITING HOURS ONLY!  YOU NEED TO HAVE A COVID 19 TEST ON_3/22______ @_2 :35______, THIS TEST MUST BE DONE BEFORE SURGERY, COME  [address] , [id number].  ([name]) ONCE YOUR COVID TEST IS COMPLETED, PLEASE BEGIN THE QUARANTINE INSTRUCTIONS AS OUTLINED IN YOUR HANDOUT.                Rodney Hughes    Your procedure is scheduled on: 07/20/19   Report to [name] Main  Entrance   Report to admitting at  9:20 AM     Call this number if you have problems the morning of surgery [phone]    Remember: Do not eat food or drink liquids :After Midnight.   BRUSH YOUR TEETH MORNING OF SURGERY AND RINSE YOUR MOUTH OUT, NO CHEWING GUM CANDY OR MINTS.     Take these medicines the morning of surgery with A SIP OF WATER: Hydralizine, Metoprolol, Amlodipine  DO NOT TAKE ANY DIABETIC MEDICATIONS DAY OF YOUR SURGERY    How to Manage Your Diabetes Before and After Surgery  Why is it important to control my blood sugar before and after surgery? . Improving blood sugar levels before and after surgery helps healing and can limit problems. . A way of improving blood sugar control is eating a healthy diet by: o  Eating less sugar and carbohydrates o  Increasing activity/exercise o  Talking with your doctor about reaching your blood sugar goals . High blood sugars (greater than 180 mg/dL) can raise your risk of infections and slow your recovery, so you will need to focus on controlling your diabetes during the weeks before surgery. . Make sure that the doctor who takes care of your diabetes knows about your planned surgery including the date and location.  How do I manage my blood sugar before surgery? . Check your blood sugar at least 4 times  a day, starting 2 days before surgery, to make sure that the level is not too high or low. o Check your blood sugar the morning of your surgery when you wake up and every 2 hours until you get to the Short Stay unit. . If your blood sugar is less than 70 mg/dL, you will need to treat for low blood sugar: o Do not take insulin. o Treat a low blood sugar (less than 70 mg/dL) with  cup of clear juice (cranberry or apple), 4 glucose tablets, OR glucose gel. o Recheck blood sugar in 15 minutes after treatment (to make sure it is greater than 70 mg/dL). If your blood sugar is not greater than 70 mg/dL on recheck, call [phone] for further instructions. . Report your blood sugar to the short stay nurse when you get to Short Stay.  . If you are admitted to the hospital after surgery: o Your blood sugar will be checked by the staff and you will probably be given insulin after surgery (instead of oral diabetes medicines) to make sure you have good blood sugar levels. o The goal for blood sugar control after surgery is 80-180 mg/dL.   WHAT DO I DO ABOUT MY DIABETES MEDICATION?  [name] Do not take oral diabetes medicines (pills) the morning of surgery.  .    You may  not have any metal on your body including              piercings  Do not wear jewelry,  lotions, powders or  deodorant                    Men may shave face and neck.   Do not bring valuables to the hospital. [place].  Contacts, dentures or bridgework may not be worn into surgery.                   Please read over the following fact sheets you were given: _____________________________________________________________________             [facility] - Preparing for Surgery  Before surgery, you can play an important role.    Because skin is not sterile, your skin needs to be as free of germs as possible.   You can reduce the number of germs on your skin by washing with CHG  (chlorahexidine gluconate) soap before surgery.   CHG is an antiseptic cleaner which kills germs and bonds with the skin to continue killing germs even after washing. Please DO NOT use if you have an allergy to CHG or antibacterial soaps.   If your skin becomes reddened/irritated stop using the CHG and inform your nurse when you arrive at Short Stay. .  You may shave your face/neck. Please follow these instructions carefully:  1.  Shower with CHG Soap the night before surgery and the  morning of Surgery.  2.  If you choose to wash your hair, wash your hair first as usual with your  normal  shampoo.  3.  After you shampoo, rinse your hair and body thoroughly to remove the  shampoo.                                       4.  Use CHG as you would any other liquid soap.  You can apply chg directly  to the skin and wash                       Gently with a scrungie or clean washcloth.  5.  Apply the CHG Soap to your body ONLY FROM THE NECK DOWN.   Do not use on face/ open                           Wound or open sores. Avoid contact with eyes, ears mouth and genitals (private parts).                       Wash face,  Genitals (private parts) with your normal soap.             6.  Wash thoroughly, paying special attention to the area where your surgery  will be performed.  7.  Thoroughly rinse your body with warm water from the neck down.  8.  DO NOT shower/wash with your normal soap after using and rinsing off  the CHG Soap.             9.  Pat yourself dry with a clean towel.  10.  Wear clean pajamas.            11.  Place clean sheets on your bed the night of your first shower and do not  sleep with pets. Day of Surgery : Do not apply any lotions/deodorants the morning of surgery.  Please wear clean clothes to the hospital/surgery center.  FAILURE TO FOLLOW THESE INSTRUCTIONS MAY RESULT IN THE CANCELLATION OF YOUR SURGERY PATIENT SIGNATURE_________________________________  NURSE  SIGNATURE__________________________________  ________________________________________________________________________   [name]  An incentive spirometer is a tool that can help keep your lungs clear and active. This tool measures how well you are filling your lungs with each breath. Taking long deep breaths may help reverse or decrease the chance of developing breathing (pulmonary) problems (especially infection) following:  A long period of time when you are unable to move or be active. BEFORE THE PROCEDURE   If the spirometer includes an indicator to show your best effort, your nurse or respiratory therapist will set it to a desired goal.  If possible, sit up straight or lean slightly forward. Try not to slouch.  Hold the incentive spirometer in an upright position. INSTRUCTIONS FOR USE  1. Sit on the edge of your bed if possible, or sit up as far as you can in bed or on a chair. 2. Hold the incentive spirometer in an upright position. 3. Breathe out normally. 4. Place the mouthpiece in your mouth and seal your lips tightly around it. 5. Breathe in slowly and as deeply as possible, raising the piston or the ball toward the top of the column. 6. Hold your breath for 3-5 seconds or for as long as possible. Allow the piston or ball to fall to the bottom of the column. 7. Remove the mouthpiece from your mouth and breathe out normally. 8. Rest for a few seconds and repeat Steps 1 through 7 at least 10 times every 1-2 hours when you are awake. Take your time and take a few normal breaths between deep breaths. 9. The spirometer may include an indicator to show your best effort. Use the indicator as a goal to work toward during each repetition. 10. After each set of 10 deep breaths, practice coughing to be sure your lungs are clear. If you have an incision (the cut made at the time of surgery), support your incision when coughing by placing a pillow or rolled up towels firmly  against it. Once you are able to get out of bed, walk around indoors and cough well. You may stop using the incentive spirometer when instructed by your caregiver.  RISKS AND COMPLICATIONS  Take your time so you do not get dizzy or light-headed.  If you are in pain, you may need to take or ask for pain medication before doing incentive spirometry. It is harder to take a deep breath if you are having pain. AFTER USE  Rest and breathe slowly and easily.  It can be helpful to keep track of a log of your progress. Your caregiver can provide you with a simple table to help with this. If you are using the spirometer at home, follow these instructions: [place] IF:   You are having difficultly using the spirometer.  You have trouble using the spirometer as often as instructed.  Your pain medication is not giving enough relief while using the spirometer.  You develop fever of 100.5 F (38.1 C) or higher. SEEK IMMEDIATE MEDICAL CARE IF:   You cough up bloody  sputum that had not been present before.  You develop fever of 102 F (38.9 C) or greater.  You develop worsening pain at or near the incision site. MAKE SURE YOU:   Understand these instructions.  Will watch your condition.  Will get help right away if you are not doing well or get worse. Document Released: 08/24/2006 Document Revised: 07/06/2011 Document Reviewed: 10/25/2006 [employer] Patient Information 2014 [employer], [place].   ________________________________________________________________________  WHAT IS A BLOOD TRANSFUSION? Blood Transfusion Information  A transfusion is the replacement of blood or some of its parts. Blood is made up of multiple cells which provide different functions.  Red blood cells carry oxygen and are used for blood loss replacement.  White blood cells fight against infection.  Platelets control bleeding.  Plasma helps clot blood.  Other blood products are available for  specialized needs, such as hemophilia or other clotting disorders. BEFORE THE TRANSFUSION  Who gives blood for transfusions?   Healthy volunteers who are fully evaluated to make sure their blood is safe. This is blood bank blood. Transfusion therapy is the safest it has ever been in the practice of medicine. Before blood is taken from a donor, a complete history is taken to make sure that person has no history of diseases nor engages in risky social behavior (examples are intravenous drug use or sexual activity with multiple partners). The donor's travel history is screened to minimize risk of transmitting infections, such as malaria. The donated blood is tested for signs of infectious diseases, such as HIV and hepatitis. The blood is then tested to be sure it is compatible with you in order to minimize the chance of a transfusion reaction. If you or a relative donates blood, this is often done in anticipation of surgery and is not appropriate for emergency situations. It takes many days to process the donated blood. RISKS AND COMPLICATIONS Although transfusion therapy is very safe and saves many lives, the main dangers of transfusion include:   Getting an infectious disease.  Developing a transfusion reaction. This is an allergic reaction to something in the blood you were given. Every precaution is taken to prevent this. The decision to have a blood transfusion has been considered carefully by your caregiver before blood is given. Blood is not given unless the benefits outweigh the risks. AFTER THE TRANSFUSION  Right after receiving a blood transfusion, you will usually feel much better and more energetic. This is especially true if your red blood cells have gotten low (anemic). The transfusion raises the level of the red blood cells which carry oxygen, and this usually causes an energy increase.  The nurse administering the transfusion will monitor you carefully for complications. HOME CARE  INSTRUCTIONS  No special instructions are needed after a transfusion. You may find your energy is better. Speak with your caregiver about any limitations on activity for underlying diseases you may have. SEEK MEDICAL CARE IF:   Your condition is not improving after your transfusion.  You develop redness or irritation at the intravenous (IV) site. SEEK IMMEDIATE MEDICAL CARE IF:  Any of the following symptoms occur over the next 12 hours:  Shaking chills.  You have a temperature by mouth above 102 F (38.9 C), not controlled by medicine.  Chest, back, or muscle pain.  People around you feel you are not acting correctly or are confused.  Shortness of breath or difficulty breathing.  Dizziness and fainting.  You get a rash or develop hives.  You have a  decrease in urine output.  Your urine turns a dark color or changes to pink, red, or brown. Any of the following symptoms occur over the next 10 days:  You have a temperature by mouth above 102 F (38.9 C), not controlled by medicine.  Shortness of breath.  Weakness after normal activity.  The white part of the eye turns yellow (jaundice).  You have a decrease in the amount of urine or are urinating less often.  Your urine turns a dark color or changes to pink, red, or brown. Document Released: 04/10/2000 Document Revised: 07/06/2011 Document Reviewed: 11/28/2007 [facility] Patient Information 2014 [place], [place].  _______________________________________________________________________

## 2019-07-10 ENCOUNTER — Encounter (HOSPITAL_COMMUNITY): Payer: Self-pay

## 2019-07-10 ENCOUNTER — Encounter (HOSPITAL_COMMUNITY)
Admission: RE | Admit: 2019-07-10 | Discharge: 2019-07-10 | Disposition: A | Discharge status: Home / Self Care | Payer: PPO | Source: Ambulatory Visit | Attending: Urology | Admitting: Urology

## 2019-07-10 ENCOUNTER — Other Ambulatory Visit: Payer: Self-pay

## 2019-07-10 ENCOUNTER — Encounter (HOSPITAL_COMMUNITY)
Admission: RE | Admit: 2019-07-10 | Discharge: 2019-07-10 | Disposition: A | Discharge status: Home / Self Care | Payer: PPO | Source: Ambulatory Visit | Attending: Family Medicine | Admitting: Family Medicine

## 2019-07-10 DIAGNOSIS — Z01812 Encounter for preprocedural laboratory examination: Secondary | ICD-10-CM | POA: Insufficient documentation

## 2019-07-10 LAB — BASIC METABOLIC PANEL
Anion gap: 9 (ref 5–15)
BUN: 18 mg/dL (ref 8–23)
CO2: 29 mmol/L (ref 22–32)
Calcium: 9.6 mg/dL (ref 8.9–10.3)
Chloride: 104 mmol/L (ref 98–111)
Creatinine, Ser: 0.91 mg/dL (ref 0.61–1.24)
GFR calc Af Amer: >60 mL/min (ref >60)
GFR calc non Af Amer: >60 mL/min (ref >60)
Glucose, Bld: 112 mg/dL — ABNORMAL HIGH (ref 70–99)
Potassium: 4.4 mmol/L (ref 3.5–5.1)
Sodium: 142 mmol/L (ref 135–145)

## 2019-07-10 LAB — CBC
HCT: 40.2 % (ref 39.0–52.0)
Hemoglobin: 13.2 g/dL (ref 13.0–17.0)
MCH: 29.2 pg (ref 26.0–34.0)
MCHC: 32.8 g/dL (ref 30.0–36.0)
MCV: 88.9 fL (ref 80.0–100.0)
Platelets: 169 10*3/uL (ref 150–400)
RBC: 4.52 MIL/uL (ref 4.22–5.81)
RDW: 12.7 % (ref 11.5–15.5)
WBC: 7.9 10*3/uL (ref 4.0–10.5)
nRBC: 0 % (ref 0.0–0.2)

## 2019-07-10 LAB — ABO/RH: ABO/RH(D): A NEG

## 2019-07-10 NOTE — Progress Notes (Signed)
PCP - Fransisco Hertz Cardiologist - no  Chest x-ray -  EKG - 11/15/18 Stress Test - no ECHO - no Cardiac Cath - no  Sleep Study - yes CPAP - yes  Fasting Blood Sugar - 110-138 Checks Blood Sugar _____ times a day 1/week  Blood Thinner Instructions:NA Aspirin Instructions: Last Dose:  Anesthesia review:   Patient denies shortness of breath, fever, cough and chest pain at PAT appointment  yes Patient verbalized understanding of instructions that were given to them at the PAT appointment. Patient was also instructed that they will need to review over the PAT instructions again at home before surgery. yes

## 2019-07-12 ENCOUNTER — Encounter (HOSPITAL_COMMUNITY): Payer: PPO

## 2019-07-14 DIAGNOSIS — C61 Malignant neoplasm of prostate: Secondary | ICD-10-CM | POA: Diagnosis not present

## 2019-07-17 ENCOUNTER — Other Ambulatory Visit (HOSPITAL_COMMUNITY)
Admission: RE | Admit: 2019-07-17 | Discharge: 2019-07-17 | Disposition: A | Discharge status: Home / Self Care | Payer: PPO | Source: Ambulatory Visit | Attending: Urology | Admitting: Urology

## 2019-07-17 DIAGNOSIS — C61 Malignant neoplasm of prostate: Secondary | ICD-10-CM | POA: Diagnosis not present

## 2019-07-17 DIAGNOSIS — Z833 Family history of diabetes mellitus: Secondary | ICD-10-CM | POA: Diagnosis not present

## 2019-07-17 DIAGNOSIS — Z01812 Encounter for preprocedural laboratory examination: Secondary | ICD-10-CM | POA: Insufficient documentation

## 2019-07-17 DIAGNOSIS — Z7984 Long term (current) use of oral hypoglycemic drugs: Secondary | ICD-10-CM | POA: Diagnosis not present

## 2019-07-17 DIAGNOSIS — Z8249 Family history of ischemic heart disease and other diseases of the circulatory system: Secondary | ICD-10-CM | POA: Diagnosis not present

## 2019-07-17 DIAGNOSIS — E119 Type 2 diabetes mellitus without complications: Secondary | ICD-10-CM | POA: Diagnosis not present

## 2019-07-17 DIAGNOSIS — Z79899 Other long term (current) drug therapy: Secondary | ICD-10-CM | POA: Diagnosis not present

## 2019-07-17 DIAGNOSIS — I10 Essential (primary) hypertension: Secondary | ICD-10-CM | POA: Diagnosis not present

## 2019-07-17 DIAGNOSIS — Z6833 Body mass index (BMI) 33.0-33.9, adult: Secondary | ICD-10-CM | POA: Diagnosis not present

## 2019-07-17 DIAGNOSIS — E669 Obesity, unspecified: Secondary | ICD-10-CM | POA: Diagnosis not present

## 2019-07-17 DIAGNOSIS — Z20822 Contact with and (suspected) exposure to covid-19: Secondary | ICD-10-CM | POA: Insufficient documentation

## 2019-07-17 DIAGNOSIS — G4730 Sleep apnea, unspecified: Secondary | ICD-10-CM | POA: Diagnosis not present

## 2019-07-17 DIAGNOSIS — M6281 Muscle weakness (generalized): Secondary | ICD-10-CM | POA: Diagnosis not present

## 2019-07-17 DIAGNOSIS — M62838 Other muscle spasm: Secondary | ICD-10-CM | POA: Diagnosis not present

## 2019-07-18 LAB — SARS CORONAVIRUS 2 (TAT 6-24 HRS): SARS Coronavirus 2: NEGATIVE

## 2019-07-19 DIAGNOSIS — M6281 Muscle weakness (generalized): Secondary | ICD-10-CM | POA: Diagnosis not present

## 2019-07-19 DIAGNOSIS — N393 Stress incontinence (female) (male): Secondary | ICD-10-CM | POA: Diagnosis not present

## 2019-07-19 DIAGNOSIS — M62838 Other muscle spasm: Secondary | ICD-10-CM | POA: Diagnosis not present

## 2019-07-19 NOTE — H&P (Signed)
Office Visit Report     07/14/2019   --------------------------------------------------------------------------------   Rodney Hughes. Witherell  MRN: [id number]  DOB: March 25, 1951, 69 year old Male  SSN: [id number]   PRIMARY CARE:  MEDICAID [facility]  REFERRING:  Carolann Littler, MD  PROVIDER:  Louis Meckel, M.D.  TREATING:  Raynelle Bring, M.D.  LOCATION:  [facility] [phone]     --------------------------------------------------------------------------------   CC/HPI: CC: Prostate Cancer   Physician requesting consult: Dr. Burman Nieves  PCP:   Rodney Hughes is a 69 year old gentleman who was initially diagnosed with low risk prostate cancer in 2015. He was managed with active surveillance under the care of Dr. Louis Meckel. 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 He is most interested in surgical treatment.   Unfortunately, he has not yet been scheduled with physical therapy.   Family history: None.   PMH: He has a history of diabetes and hypertension.  PSH: No abdominal surgery.   TNM stage: cT1c Nx Mx  PSA: 8.73  Gleason score: 4+3=7 (Grade group 4)  Biopsy (06/05/19): 4/12 cores positive  Left: L lateral apex (50%, 4+3=7), L apex (5%, 3+3=6), L lateral mid (20%, 3+4=7)  Right: R apex (10%, 3+3=6)  Prostate volume: 41.0 cc   Nomogram  OC disease: 41%  EPE: 55%  SVI: 11%  LNI: 10%  PFS (5 year, 10 year): 61%, 45%   Urinary function: IPSS is 5.  Erectile function: SHIM score is 20.     ALLERGIES: No Allergies    MEDICATIONS: Metformin Hcl  Amlodipine Besylate 10 mg tablet Oral  Glipizide 5 mg tablet  Hydralazine Hcl  Levofloxacin 750 mg tablet 1 tablet PO once take on morning of prostate biopsy.  Losartan Potassium 100 mg tablet Oral  Metoprolol Succinate 100 mg tablet, extended release 24 hr Oral   Multi Vitamin  Rosuvastatin Calcium 10 mg tablet  Turmeric     Notes: Metformin 3 tablets daily   GU PSH: Prostate Needle Biopsy - 06/05/2019       PSH Notes: Complete Colonoscopy, Knee Surgery, Tonsillectomy   NON-GU PSH: Diagnostic Colonoscopy - 2015 Remove Tonsils - 2015 Surgical Pathology, Gross And Microscopic Examination For Prostate Needle - 06/05/2019     GU PMH: Prostate Cancer - 03/13/2019, - 07/26/2018, - 01/10/2018, - 2019, - 2018, The patient's rectal exam today is reassuring. His most recent biopsy, a repeat biopsy was also reassuring. This point, the patient is on active surveillance with no evidence of progression. He is due for a PSA today. Assuming this is normal, we'll continue to follow on an 6 month interval., - 2018, - 2017, Prostate cancer, - 2017 Nocturia - 2018 Elevated PSA, Elevated prostate specific antigen (PSA) - 2015      PMH Notes: PSA:  6.16 on 07/04/15  5.02 on 01/2015  4.82 on 10/2014  6.48 on 08/01/14  3.97 on 01/29/14  Prostate biopsy 08/25/13  4.9 on 07/13/13  4.5 on 05/19/13  3.01 on 04/2012  4K-score: 23% chance of high-grade prostate cancer on 07/2013    IPSS: 0,0  SHIM: 25   Prostate cancer profile  Stage: T2a  PSA: 4.9   08/25/13: Biopsy , 4 /12 cores positive: Gleason 3+3 = 6 left lateral apex (20%), Gleason 3+3 = 6 left lateral mid (5%), Gleason 3+3 = 6 left medial apex (10%), Gleason 3+3 =  6 right medial base (5%)  Prostate volume: 37 g  3/17 Biopsy: 2/12 cores positive: Gleason 3+3=6 LLB/LLA   Prsotate MRI 01/19/14:  IMPRESSION:  1. No convincing evidence of prostate carcinoma in the peripheral zone. One mildly suspicious region in the right mid gland.  2. No restricted diffusion or abnormal enhancement.  3. Prostatic capsule is intact. No transcapsular spread.  4. No lymphadenopathy.   1) Prostate cancer: He is s/p a BNS RAL radical prostatectomy and BPLND on 07/20/19.   Diagnosis:  Pretreatment PSA: 8.73  Pretreatment SHIM score:     NON-GU PMH: Encounter for general adult medical examination without abnormal findings, Encounter for preventive health examination - 2017 Personal history of other diseases of the circulatory system, History of hypertension - 2015 Personal history of other diseases of the musculoskeletal system and connective tissue, History of gout - 2015 Personal history of other diseases of the nervous system and sense organs, History of sleep apnea - 2015    FAMILY HISTORY: Diabetes - Runs In Family Hypertension - Runs In Family   SOCIAL HISTORY: Marital Status: Married Preferred Language: English; Ethnicity: Not Hispanic Or Latino; Race: White Current Smoking Status: Patient has never smoked.   Tobacco Use Assessment Completed: Used Tobacco in last 30 days? Social Drinker.  Drinks 1 caffeinated drink per day.     Notes: Father deceased, Occupation, Caffeine use, Alcohol use, Married, Never a smoker   REVIEW OF SYSTEMS:    GU Review Male:   Patient denies frequent urination, hard to postpone urination, burning/ pain with urination, get up at night to urinate, leakage of urine, stream starts and stops, trouble starting your streams, and have to strain to urinate .  Gastrointestinal (Upper):   Patient denies nausea and vomiting.  Gastrointestinal (Lower):   Patient denies diarrhea and constipation.  Constitutional:   Patient denies fever, night sweats, weight loss, and fatigue.  Skin:   Patient denies itching and skin rash/ lesion.  Eyes:   Patient denies blurred vision and double vision.  Ears/ Nose/ Throat:   Patient denies sore throat and sinus problems.  Hematologic/Lymphatic:   Patient denies swollen glands and easy bruising.  Cardiovascular:   Patient denies leg swelling and chest pains.  Respiratory:   Patient denies cough and shortness of breath.  Endocrine:   Patient denies excessive thirst.  Musculoskeletal:   Patient denies back pain and joint pain.  Neurological:   Patient denies  headaches and dizziness.  Psychologic:   Patient denies depression and anxiety.   VITAL SIGNS:      07/14/2019 12:17 PM  Weight 260 lb / 117.93 kg  Height 74 in / 187.96 cm  BP 172/83 mmHg  Pulse 69 /min  BMI 33.4 kg/m   GU PHYSICAL EXAMINATION:    Prostate: Prostate about 50 grams. Left lobe normal consistency, right lobe normal consistency. Symmetrical lobes. No prostate nodule. Left lobe no tenderness, right lobe no tenderness.    MULTI-SYSTEM PHYSICAL EXAMINATION:    Constitutional: Well-nourished. No physical deformities. Normally developed. Good grooming.  Neck: Neck symmetrical, not swollen. Normal tracheal position.  Respiratory: No labored breathing, no use of accessory muscles. Clear bilaterally.  Cardiovascular: Normal temperature, normal extremity pulses, no swelling, no varicosities. Regular rate and rhythm.  Lymphatic: No enlargement of neck, axillae, groin.  Skin: No paleness, no jaundice, no cyanosis. No lesion, no ulcer, no rash.  Neurologic / Psychiatric: Oriented to time, oriented to place, oriented to person. No depression, no anxiety, no agitation.  Gastrointestinal:  No mass, no tenderness, no rigidity, non obese abdomen.  Eyes: Normal conjunctivae. Normal eyelids.  Ears, Nose, Mouth, and Throat: Left ear no scars, no lesions, no masses. Right ear no scars, no lesions, no masses. Nose no scars, no lesions, no masses. Normal hearing. Normal lips.  Musculoskeletal: Normal gait and station of head and neck.     PAST DATA REVIEWED:  Source Of History:  Patient  Lab Test Review:   PSA  Records Review:   Pathology Reports, Previous Patient Records  Urine Test Review:   Urinalysis   03/08/19 07/12/18 01/04/18 05/17/17 11/09/16 05/19/16 12/26/15 07/05/15  PSA  Total PSA 8.73 ng/mL 6.44 ng/mL 6.84 ng/mL 7.30 ng/mL 6.39 ng/mL 5.36 ng/dl 5.58  6.16   Free PSA 1.07 ng/mL 0.88 ng/mL  0.80 ng/mL 0.74 ng/mL 0.73 ng/dl    % Free PSA 12 % PSA 14 % PSA  11 % PSA 12 % PSA 14 %       PROCEDURES:          Urinalysis Dipstick Dipstick Cont'd  Color: Yellow Bilirubin: Neg mg/dL  Appearance: Clear Ketones: Neg mg/dL  Specific Gravity: 1.020 Blood: Neg ery/uL  pH: 6.0 Protein: Neg mg/dL  Glucose: Neg mg/dL Urobilinogen: 0.2 mg/dL    Nitrites: Neg    Leukocyte Esterase: Neg leu/uL    ASSESSMENT:      ICD-10 Details  1 GU:   Prostate Cancer - C61    PLAN:           Schedule Return Visit/Planned Activity: Keep Scheduled Appointment  Return Visit/Planned Activity: Next Available Appointment - PT Referral             Note: Please schedule patient for preoperative PT prior to radical prostatectomy.  (Patient has surgery scheduled for next Thursday. If he cannot get in for PT before then, it would be best to schedule him for about 2-3 weeks out from his surgery date.)          Document Letter(s):  Created for Patient: Clinical Summary         Notes:   1. Intermediate risk prostate cancer: He has made the decision to proceed with surgical therapy and he does appear to be an appropriate candidate to proceed in this fashion. The patient was counseled about the natural history of prostate cancer and the standard treatment options that are available for prostate cancer. It was explained to him how his age and life expectancy, clinical stage, Gleason score, and PSA affect his prognosis, the decision to proceed with additional staging studies, as well as how that information influences recommended treatment strategies. We discussed the roles for active surveillance, radiation therapy, surgical therapy, androgen deprivation, as well as ablative therapy options for the treatment of prostate cancer as appropriate to his individual cancer situation. We discussed the risks and benefits of these options with regard to their impact on cancer control and also in terms of potential adverse events, complications, and impact on quality of life particularly related to urinary and sexual  function. The patient was encouraged to ask questions throughout the discussion today and all questions were answered to his stated satisfaction. In addition, the patient was provided with and/or directed to appropriate resources and literature for further education about prostate cancer and treatment options. We discussed surgical therapy for prostate cancer including the different available surgical approaches. We discussed, in detail, the risks and expectations of surgery with regard to cancer control, urinary control, and erectile function as well as  the expected postoperative recovery process. Additional risks of surgery including but not limited to bleeding, infection, hernia formation, nerve damage, lymphocele formation, bowel/rectal injury potentially necessitating colostomy, damage to the urinary tract resulting in urine leakage, urethral stricture, and the cardiopulmonary risks such as myocardial infarction, stroke, death, venothromboembolism, etc. were explained. The risk of open surgical conversion for robotic/laparoscopic prostatectomy was also discussed.   He has not yet started physical therapy preoperatively. I will schedule him to be begin either before his surgery or hopefully shortly after. We have briefly discussed pelvic floor exercises and training today.   He will be scheduled for a bilateral nerve-sparing robot assisted laparoscopic radical prostatectomy and bilateral pelvic lymphadenectomy.   Cc: Dr. Louis Meckel  Dr. Carolann Littler         Next Appointment:      Next Appointment: 07/17/2019 01:00 PM    Appointment Type: 72 Physical Therapy    Location: [facility] [phone]    Provider: Orlinda Blalock    Reason for Visit: Next Available - Pre op PT Prior to radical prostatectomy      E & M CODES: I spent 45 minutes total time with the patient.     * Signed by Raynelle Bring, M.D. on 07/14/19 at 6:16 PM (EDT)*

## 2019-07-20 ENCOUNTER — Ambulatory Visit (HOSPITAL_COMMUNITY): Payer: PPO

## 2019-07-20 ENCOUNTER — Observation Stay (HOSPITAL_COMMUNITY)
Admission: RE | Admit: 2019-07-20 | Discharge: 2019-07-21 | Disposition: A | Discharge status: Home / Self Care | Payer: PPO | Attending: Urology | Admitting: Urology

## 2019-07-20 ENCOUNTER — Ambulatory Visit (HOSPITAL_COMMUNITY): Payer: PPO | Admitting: Physician Assistant

## 2019-07-20 ENCOUNTER — Encounter (HOSPITAL_COMMUNITY): Payer: Self-pay | Admitting: Urology

## 2019-07-20 ENCOUNTER — Other Ambulatory Visit: Payer: Self-pay

## 2019-07-20 ENCOUNTER — Encounter (HOSPITAL_COMMUNITY)
Admission: RE | Disposition: A | Discharge status: Home / Self Care | Payer: Self-pay | Source: Home / Self Care | Attending: Urology

## 2019-07-20 DIAGNOSIS — Z833 Family history of diabetes mellitus: Secondary | ICD-10-CM | POA: Insufficient documentation

## 2019-07-20 DIAGNOSIS — G4730 Sleep apnea, unspecified: Secondary | ICD-10-CM | POA: Diagnosis not present

## 2019-07-20 DIAGNOSIS — Z20822 Contact with and (suspected) exposure to covid-19: Secondary | ICD-10-CM | POA: Insufficient documentation

## 2019-07-20 DIAGNOSIS — Z6833 Body mass index (BMI) 33.0-33.9, adult: Secondary | ICD-10-CM | POA: Insufficient documentation

## 2019-07-20 DIAGNOSIS — E669 Obesity, unspecified: Secondary | ICD-10-CM | POA: Diagnosis not present

## 2019-07-20 DIAGNOSIS — Z7984 Long term (current) use of oral hypoglycemic drugs: Secondary | ICD-10-CM | POA: Insufficient documentation

## 2019-07-20 DIAGNOSIS — I10 Essential (primary) hypertension: Secondary | ICD-10-CM | POA: Insufficient documentation

## 2019-07-20 DIAGNOSIS — G4733 Obstructive sleep apnea (adult) (pediatric): Secondary | ICD-10-CM | POA: Diagnosis not present

## 2019-07-20 DIAGNOSIS — C61 Malignant neoplasm of prostate: Principal | ICD-10-CM | POA: Diagnosis present

## 2019-07-20 DIAGNOSIS — E119 Type 2 diabetes mellitus without complications: Secondary | ICD-10-CM | POA: Diagnosis not present

## 2019-07-20 DIAGNOSIS — Z8249 Family history of ischemic heart disease and other diseases of the circulatory system: Secondary | ICD-10-CM | POA: Diagnosis not present

## 2019-07-20 DIAGNOSIS — D696 Thrombocytopenia, unspecified: Secondary | ICD-10-CM | POA: Diagnosis not present

## 2019-07-20 DIAGNOSIS — Z79899 Other long term (current) drug therapy: Secondary | ICD-10-CM | POA: Insufficient documentation

## 2019-07-20 HISTORY — PX: ROBOT ASSISTED LAPAROSCOPIC RADICAL PROSTATECTOMY: SHX5141

## 2019-07-20 HISTORY — PX: LYMPHADENECTOMY: SHX5960

## 2019-07-20 LAB — HEMOGLOBIN AND HEMATOCRIT, BLOOD
HCT: 37 % — ABNORMAL LOW (ref 39.0–52.0)
Hemoglobin: 11.9 g/dL — ABNORMAL LOW (ref 13.0–17.0)

## 2019-07-20 LAB — TYPE AND SCREEN
ABO/RH(D): A NEG
Antibody Screen: NEGATIVE

## 2019-07-20 LAB — HEMOGLOBIN A1C
Hgb A1c MFr Bld: 7.3 % — ABNORMAL HIGH (ref 4.8–5.6)
Mean Plasma Glucose: 162.81 mg/dL

## 2019-07-20 LAB — GLUCOSE, CAPILLARY
Glucose-Capillary: 117 mg/dL — ABNORMAL HIGH (ref 70–99)
Glucose-Capillary: 165 mg/dL — ABNORMAL HIGH (ref 70–99)
Glucose-Capillary: 183 mg/dL — ABNORMAL HIGH (ref 70–99)
Glucose-Capillary: 245 mg/dL — ABNORMAL HIGH (ref 70–99)

## 2019-07-20 SURGERY — XI ROBOTIC ASSISTED LAPAROSCOPIC RADICAL PROSTATECTOMY LEVEL 2
Anesthesia: General

## 2019-07-20 MED ORDER — CEFAZOLIN SODIUM-DEXTROSE 1-4 GM/50ML-% IV SOLN
1.0000 g | Freq: Three times a day (TID) | INTRAVENOUS | Status: AC
  Administered 2019-07-20 – 2019-07-21 (×2): 1 g via INTRAVENOUS
  Filled 2019-07-20 (×2): qty 50

## 2019-07-20 MED ORDER — BACITRACIN-NEOMYCIN-POLYMYXIN 400-5-5000 EX OINT: 1.0000 "application " | TOPICAL_OINTMENT | Freq: Three times a day (TID) | CUTANEOUS | Status: DC | PRN

## 2019-07-20 MED ORDER — HYDRALAZINE HCL 25 MG PO TABS
25.0000 mg | ORAL_TABLET | Freq: Three times a day (TID) | ORAL | Status: DC
  Administered 2019-07-20 – 2019-07-21 (×2): 25 mg via ORAL
  Filled 2019-07-20 (×2): qty 1

## 2019-07-20 MED ORDER — SODIUM CHLORIDE 0.9 % IV BOLUS
1000.0000 mL | Freq: Once | INTRAVENOUS | Status: AC
  Administered 2019-07-20: 1000 mL via INTRAVENOUS

## 2019-07-20 MED ORDER — ROCURONIUM BROMIDE 10 MG/ML (PF) SYRINGE
PREFILLED_SYRINGE | INTRAVENOUS | Status: AC
  Filled 2019-07-20: qty 10

## 2019-07-20 MED ORDER — DIPHENHYDRAMINE HCL 50 MG/ML IJ SOLN: 12.5000 mg | Freq: Four times a day (QID) | INTRAMUSCULAR | Status: DC | PRN

## 2019-07-20 MED ORDER — ROSUVASTATIN CALCIUM 10 MG PO TABS
10.0000 mg | ORAL_TABLET | Freq: Every day | ORAL | Status: DC
  Administered 2019-07-20: 10 mg via ORAL
  Filled 2019-07-20: qty 1

## 2019-07-20 MED ORDER — LOSARTAN POTASSIUM 50 MG PO TABS
100.0000 mg | ORAL_TABLET | Freq: Every day | ORAL | Status: DC
  Administered 2019-07-21: 100 mg via ORAL
  Filled 2019-07-20 (×2): qty 2

## 2019-07-20 MED ORDER — ZOLPIDEM TARTRATE 5 MG PO TABS: 5.0000 mg | ORAL_TABLET | Freq: Every evening | ORAL | Status: DC | PRN

## 2019-07-20 MED ORDER — PROPOFOL 10 MG/ML IV BOLUS
INTRAVENOUS | Status: AC
  Filled 2019-07-20: qty 20

## 2019-07-20 MED ORDER — ONDANSETRON HCL 4 MG/2ML IJ SOLN
INTRAMUSCULAR | Status: DC | PRN
  Administered 2019-07-20: 4 mg via INTRAVENOUS

## 2019-07-20 MED ORDER — KETOROLAC TROMETHAMINE 15 MG/ML IJ SOLN
15.0000 mg | Freq: Four times a day (QID) | INTRAMUSCULAR | Status: DC
  Administered 2019-07-20 – 2019-07-21 (×3): 15 mg via INTRAVENOUS
  Filled 2019-07-20 (×3): qty 1

## 2019-07-20 MED ORDER — PROMETHAZINE HCL 25 MG/ML IJ SOLN: 6.2500 mg | INTRAMUSCULAR | Status: DC | PRN

## 2019-07-20 MED ORDER — DEXAMETHASONE SODIUM PHOSPHATE 10 MG/ML IJ SOLN
INTRAMUSCULAR | Status: AC
  Filled 2019-07-20: qty 1

## 2019-07-20 MED ORDER — CHLORHEXIDINE GLUCONATE CLOTH 2 % EX PADS
6.0000 | MEDICATED_PAD | Freq: Every day | CUTANEOUS | Status: DC
  Administered 2019-07-21: 6 via TOPICAL

## 2019-07-20 MED ORDER — AMLODIPINE BESYLATE 10 MG PO TABS
10.0000 mg | ORAL_TABLET | Freq: Every day | ORAL | Status: DC
  Administered 2019-07-21: 10 mg via ORAL
  Filled 2019-07-20 (×2): qty 1

## 2019-07-20 MED ORDER — INSULIN ASPART 100 UNIT/ML ~~LOC~~ SOLN
0.0000 [IU] | SUBCUTANEOUS | Status: DC
  Administered 2019-07-20: 3 [IU] via SUBCUTANEOUS
  Administered 2019-07-20: 5 [IU] via SUBCUTANEOUS
  Administered 2019-07-21 (×2): 2 [IU] via SUBCUTANEOUS

## 2019-07-20 MED ORDER — KCL IN DEXTROSE-NACL 20-5-0.45 MEQ/L-%-% IV SOLN
INTRAVENOUS | Status: DC
  Filled 2019-07-20: qty 1000

## 2019-07-20 MED ORDER — BUPIVACAINE HCL 0.25 % IJ SOLN
INTRAMUSCULAR | Status: AC
  Filled 2019-07-20: qty 1

## 2019-07-20 MED ORDER — LIDOCAINE 2% (20 MG/ML) 5 ML SYRINGE
INTRAMUSCULAR | Status: AC
  Filled 2019-07-20: qty 5

## 2019-07-20 MED ORDER — HYDROMORPHONE HCL 1 MG/ML IJ SOLN: 0.2500 mg | INTRAMUSCULAR | Status: DC | PRN

## 2019-07-20 MED ORDER — MORPHINE SULFATE (PF) 2 MG/ML IV SOLN: 2.0000 mg | INTRAVENOUS | Status: DC | PRN

## 2019-07-20 MED ORDER — LIDOCAINE 2% (20 MG/ML) 5 ML SYRINGE
INTRAMUSCULAR | Status: DC | PRN
  Administered 2019-07-20: 100 mg via INTRAVENOUS

## 2019-07-20 MED ORDER — SUGAMMADEX SODIUM 500 MG/5ML IV SOLN
INTRAVENOUS | Status: DC | PRN
  Administered 2019-07-20: 250 mg via INTRAVENOUS

## 2019-07-20 MED ORDER — SULFAMETHOXAZOLE-TRIMETHOPRIM 800-160 MG PO TABS: 1.0000 | ORAL_TABLET | Freq: Two times a day (BID) | ORAL | 0 refills | Status: DC

## 2019-07-20 MED ORDER — FENTANYL CITRATE (PF) 100 MCG/2ML IJ SOLN
INTRAMUSCULAR | Status: DC | PRN
  Administered 2019-07-20 (×3): 50 ug via INTRAVENOUS
  Administered 2019-07-20: 100 ug via INTRAVENOUS

## 2019-07-20 MED ORDER — PROPOFOL 10 MG/ML IV BOLUS
INTRAVENOUS | Status: DC | PRN
  Administered 2019-07-20: 150 mg via INTRAVENOUS

## 2019-07-20 MED ORDER — METOPROLOL SUCCINATE ER 100 MG PO TB24
100.0000 mg | ORAL_TABLET | Freq: Every day | ORAL | Status: DC
  Administered 2019-07-21: 100 mg via ORAL
  Filled 2019-07-20 (×2): qty 1

## 2019-07-20 MED ORDER — DEXAMETHASONE SODIUM PHOSPHATE 10 MG/ML IJ SOLN
INTRAMUSCULAR | Status: DC | PRN
  Administered 2019-07-20: 4 mg via INTRAVENOUS

## 2019-07-20 MED ORDER — FENTANYL CITRATE (PF) 250 MCG/5ML IJ SOLN
INTRAMUSCULAR | Status: AC
  Filled 2019-07-20: qty 5

## 2019-07-20 MED ORDER — LACTATED RINGERS IV SOLN: INTRAVENOUS | Status: DC | PRN

## 2019-07-20 MED ORDER — OXYCODONE HCL 5 MG PO TABS: 5.0000 mg | ORAL_TABLET | Freq: Once | ORAL | Status: DC | PRN

## 2019-07-20 MED ORDER — ACETAMINOPHEN 325 MG PO TABS
650.0000 mg | ORAL_TABLET | ORAL | Status: DC | PRN
  Administered 2019-07-20 – 2019-07-21 (×3): 650 mg via ORAL
  Filled 2019-07-20 (×3): qty 2

## 2019-07-20 MED ORDER — EPHEDRINE SULFATE-NACL 50-0.9 MG/10ML-% IV SOSY
PREFILLED_SYRINGE | INTRAVENOUS | Status: DC | PRN
  Administered 2019-07-20: 5 mg via INTRAVENOUS
  Administered 2019-07-20: 10 mg via INTRAVENOUS

## 2019-07-20 MED ORDER — BELLADONNA ALKALOIDS-OPIUM 16.2-60 MG RE SUPP: 1.0000 | Freq: Four times a day (QID) | RECTAL | Status: DC | PRN

## 2019-07-20 MED ORDER — ONDANSETRON HCL 4 MG/2ML IJ SOLN
INTRAMUSCULAR | Status: AC
  Filled 2019-07-20: qty 2

## 2019-07-20 MED ORDER — CEFAZOLIN SODIUM-DEXTROSE 2-4 GM/100ML-% IV SOLN
2.0000 g | Freq: Once | INTRAVENOUS | Status: AC
  Administered 2019-07-20: 2 g via INTRAVENOUS
  Filled 2019-07-20: qty 100

## 2019-07-20 MED ORDER — POTASSIUM CHLORIDE IN NACL 20-0.45 MEQ/L-% IV SOLN
INTRAVENOUS | Status: DC
  Filled 2019-07-20 (×3): qty 1000

## 2019-07-20 MED ORDER — MIDAZOLAM HCL 2 MG/2ML IJ SOLN
INTRAMUSCULAR | Status: AC
  Filled 2019-07-20: qty 2

## 2019-07-20 MED ORDER — STERILE WATER FOR IRRIGATION IR SOLN
Status: DC | PRN
  Administered 2019-07-20: 1000 mL

## 2019-07-20 MED ORDER — SODIUM CHLORIDE 0.9 % IR SOLN
Status: DC | PRN
  Administered 2019-07-20: 1000 mL via INTRAVESICAL

## 2019-07-20 MED ORDER — DIPHENHYDRAMINE HCL 12.5 MG/5ML PO ELIX: 12.5000 mg | ORAL_SOLUTION | Freq: Four times a day (QID) | ORAL | Status: DC | PRN

## 2019-07-20 MED ORDER — PHENYLEPHRINE HCL (PRESSORS) 10 MG/ML IV SOLN
INTRAVENOUS | Status: AC
  Filled 2019-07-20: qty 1

## 2019-07-20 MED ORDER — TRAMADOL HCL 50 MG PO TABS: 50.0000 mg | ORAL_TABLET | Freq: Four times a day (QID) | ORAL | 0 refills | Status: DC | PRN

## 2019-07-20 MED ORDER — ROCURONIUM BROMIDE 50 MG/5ML IV SOSY
PREFILLED_SYRINGE | INTRAVENOUS | Status: DC | PRN
  Administered 2019-07-20: 100 mg via INTRAVENOUS
  Administered 2019-07-20: 20 mg via INTRAVENOUS

## 2019-07-20 MED ORDER — OXYCODONE HCL 5 MG/5ML PO SOLN: 5.0000 mg | Freq: Once | ORAL | Status: DC | PRN

## 2019-07-20 MED ORDER — DOCUSATE SODIUM 100 MG PO CAPS
100.0000 mg | ORAL_CAPSULE | Freq: Two times a day (BID) | ORAL | Status: DC
  Administered 2019-07-20 – 2019-07-21 (×2): 100 mg via ORAL
  Filled 2019-07-20 (×2): qty 1

## 2019-07-20 MED ORDER — ONDANSETRON HCL 4 MG/2ML IJ SOLN: 4.0000 mg | INTRAMUSCULAR | Status: DC | PRN

## 2019-07-20 MED ORDER — MIDAZOLAM HCL 5 MG/5ML IJ SOLN
INTRAMUSCULAR | Status: DC | PRN
  Administered 2019-07-20: 2 mg via INTRAVENOUS

## 2019-07-20 MED ORDER — HEPARIN SODIUM (PORCINE) 1000 UNIT/ML IJ SOLN
INTRAMUSCULAR | Status: AC
  Filled 2019-07-20: qty 1

## 2019-07-20 SURGICAL SUPPLY — 60 items
APPLICATOR COTTON TIP 6 STRL (MISCELLANEOUS) ×2 IMPLANT
APPLICATOR COTTON TIP 6IN STRL (MISCELLANEOUS) ×4
CATH FOLEY 2WAY SLVR 18FR 30CC (CATHETERS) ×4 IMPLANT
CATH ROBINSON RED A/P 16FR (CATHETERS) ×4 IMPLANT
CATH ROBINSON RED A/P 8FR (CATHETERS) ×4 IMPLANT
CATH TIEMANN FOLEY 18FR 5CC (CATHETERS) ×4 IMPLANT
CHLORAPREP W/TINT 26 (MISCELLANEOUS) ×4 IMPLANT
CLIP VESOLOCK LG 6/CT PURPLE (CLIP) ×10 IMPLANT
COVER SURGICAL LIGHT HANDLE (MISCELLANEOUS) ×4 IMPLANT
COVER TIP SHEARS 8 DVNC (MISCELLANEOUS) ×2 IMPLANT
COVER TIP SHEARS 8MM DA VINCI (MISCELLANEOUS) ×4
COVER WAND RF STERILE (DRAPES) IMPLANT
CUTTER ECHEON FLEX ENDO 45 340 (ENDOMECHANICALS) ×4 IMPLANT
DECANTER SPIKE VIAL GLASS SM (MISCELLANEOUS) ×4 IMPLANT
DERMABOND ADVANCED (GAUZE/BANDAGES/DRESSINGS) ×2
DERMABOND ADVANCED .7 DNX12 (GAUZE/BANDAGES/DRESSINGS) ×2 IMPLANT
DRAIN CHANNEL RND F F (WOUND CARE) IMPLANT
DRAPE ARM DVNC X/XI (DISPOSABLE) ×8 IMPLANT
DRAPE COLUMN DVNC XI (DISPOSABLE) ×2 IMPLANT
DRAPE DA VINCI XI ARM (DISPOSABLE) ×16
DRAPE DA VINCI XI COLUMN (DISPOSABLE) ×4
DRAPE SURG IRRIG POUCH 19X23 (DRAPES) ×4 IMPLANT
DRSG TEGADERM 4X4.75 (GAUZE/BANDAGES/DRESSINGS) ×4 IMPLANT
ELECT REM PT RETURN 15FT ADLT (MISCELLANEOUS) ×4 IMPLANT
GAUZE SPONGE 4X4 12PLY STRL (GAUZE/BANDAGES/DRESSINGS) ×2 IMPLANT
GLOVE BIO SURGEON STRL SZ 6.5 (GLOVE) ×3 IMPLANT
GLOVE BIO SURGEONS STRL SZ 6.5 (GLOVE) ×1
GLOVE BIOGEL M STRL SZ7.5 (GLOVE) ×8 IMPLANT
GOWN STRL REUS W/TWL LRG LVL3 (GOWN DISPOSABLE) ×12 IMPLANT
HOLDER FOLEY CATH W/STRAP (MISCELLANEOUS) ×4 IMPLANT
IRRIG SUCT STRYKERFLOW 2 WTIP (MISCELLANEOUS) ×4
IRRIGATION SUCT STRKRFLW 2 WTP (MISCELLANEOUS) ×2 IMPLANT
IV LACTATED RINGERS 1000ML (IV SOLUTION) ×4 IMPLANT
KIT TURNOVER KIT A (KITS) IMPLANT
NDL SAFETY ECLIPSE 18X1.5 (NEEDLE) ×2 IMPLANT
NEEDLE HYPO 18GX1.5 SHARP (NEEDLE) ×4
PACK ROBOT UROLOGY CUSTOM (CUSTOM PROCEDURE TRAY) ×4 IMPLANT
PENCIL SMOKE EVACUATOR (MISCELLANEOUS) IMPLANT
RELOAD STAPLE 45 4.1 GRN THCK (STAPLE) ×2 IMPLANT
SEAL CANN UNIV 5-8 DVNC XI (MISCELLANEOUS) ×8 IMPLANT
SEAL XI 5MM-8MM UNIVERSAL (MISCELLANEOUS) ×16
SET TUBE SMOKE EVAC HIGH FLOW (TUBING) ×4 IMPLANT
SOLUTION ELECTROLUBE (MISCELLANEOUS) ×4 IMPLANT
STAPLE RELOAD 45 GRN (STAPLE) ×2 IMPLANT
STAPLE RELOAD 45MM GREEN (STAPLE) ×4
SUT ETHILON 3 0 PS 1 (SUTURE) ×4 IMPLANT
SUT MNCRL 3 0 RB1 (SUTURE) ×2 IMPLANT
SUT MNCRL 3 0 VIOLET RB1 (SUTURE) ×2 IMPLANT
SUT MNCRL AB 4-0 PS2 18 (SUTURE) ×8 IMPLANT
SUT MONOCRYL 3 0 RB1 (SUTURE) ×8
SUT VIC AB 0 CT1 27 (SUTURE) ×4
SUT VIC AB 0 CT1 27XBRD ANTBC (SUTURE) ×2 IMPLANT
SUT VIC AB 0 UR5 27 (SUTURE) ×4 IMPLANT
SUT VIC AB 2-0 SH 27 (SUTURE) ×4
SUT VIC AB 2-0 SH 27X BRD (SUTURE) ×2 IMPLANT
SUT VICRYL 0 UR6 27IN ABS (SUTURE) ×8 IMPLANT
SYR 27GX1/2 1ML LL SAFETY (SYRINGE) ×4 IMPLANT
TOWEL OR NON WOVEN STRL DISP B (DISPOSABLE) ×4 IMPLANT
TROCAR XCEL NON-BLD 5MMX100MML (ENDOMECHANICALS) IMPLANT
WATER STERILE IRR 1000ML POUR (IV SOLUTION) ×4 IMPLANT

## 2019-07-20 NOTE — Interval H&P Note (Signed)
History and Physical Interval Note:  07/20/2019 10:10 AM  Rodney Hughes  has presented today for surgery, with the diagnosis of PROSTATE CANCER.  The various methods of treatment have been discussed with the patient and family. After consideration of risks, benefits and other options for treatment, the patient has consented to  Procedure(s): XI ROBOTIC [place] 2 (N/A) LYMPHADENECTOMY (Bilateral) as a surgical intervention.  The patient's history has been reviewed, patient examined, no change in status, stable for surgery.  I have reviewed the patient's chart and labs.  Questions were answered to the patient's satisfaction.     [employer]

## 2019-07-20 NOTE — Anesthesia Preprocedure Evaluation (Signed)
Anesthesia Evaluation  Patient identified by MRN, date of birth, ID band Patient awake    Reviewed: Allergy & Precautions, NPO status , Patient's Chart, lab work & pertinent test results  Airway Mallampati: II  TM Distance: >3 FB Neck ROM: Full    Dental no notable dental hx.    Pulmonary sleep apnea ,    Pulmonary exam normal breath sounds clear to auscultation       Cardiovascular hypertension, Pt. on medications negative cardio ROS Normal cardiovascular exam Rhythm:Regular Rate:Normal     Neuro/Psych negative neurological ROS  negative psych ROS   GI/Hepatic negative GI ROS, Neg liver ROS,   Endo/Other  negative endocrine ROSdiabetes  Renal/GU negative Renal ROS  negative genitourinary   Musculoskeletal negative musculoskeletal ROS (+)   Abdominal (+) + obese,   Peds negative pediatric ROS (+)  Hematology negative hematology ROS (+)   Anesthesia Other Findings Prostate Cancer  Reproductive/Obstetrics negative OB ROS                             Anesthesia Physical Anesthesia Plan  ASA: III  Anesthesia Plan: General   Post-op Pain Management:    Induction: Intravenous  PONV Risk Score and Plan: 2 and Ondansetron, Midazolam and Treatment may vary due to age or medical condition  Airway Management Planned: Oral ETT  Additional Equipment:   Intra-op Plan:   Post-operative Plan: Extubation in OR  Informed Consent: I have reviewed the patients History and Physical, chart, labs and discussed the procedure including the risks, benefits and alternatives for the proposed anesthesia with the patient or authorized representative who has indicated his/her understanding and acceptance.     Dental advisory given  Plan Discussed with: CRNA  Anesthesia Plan Comments:         Anesthesia Quick Evaluation

## 2019-07-20 NOTE — Anesthesia Postprocedure Evaluation (Signed)
Anesthesia Post Note  Patient: [name]  Procedure(s) Performed: XI ROBOTIC ASSISTED LAPAROSCOPIC RADICAL PROSTATECTOMY LEVEL 2 (N/A ) LYMPHADENECTOMY (Bilateral )     Patient location during evaluation: PACU Anesthesia Type: General Level of consciousness: awake and alert Pain management: pain level controlled Vital Signs Assessment: post-procedure vital signs reviewed and stable Respiratory status: spontaneous breathing, nonlabored ventilation and respiratory function stable Cardiovascular status: blood pressure returned to baseline and stable Postop Assessment: no apparent nausea or vomiting Anesthetic complications: no    Last Vitals:  Vitals:   07/20/19 1445 07/20/19 1500  BP: 120/76 127/77  Pulse: (!) 53 (!) 56  Resp: 15 13  Temp:    SpO2: 100% 96%    Last Pain:  Vitals:   07/20/19 1500  TempSrc:   PainSc: Asleep                 Lynda Rainwater

## 2019-07-20 NOTE — Op Note (Signed)

## 2019-07-20 NOTE — Discharge Instructions (Signed)

## 2019-07-20 NOTE — Transfer of Care (Signed)
Immediate Anesthesia Transfer of Care Note  Patient: Rodney Hughes  Procedure(s) Performed: XI ROBOTIC ASSISTED LAPAROSCOPIC RADICAL PROSTATECTOMY LEVEL 2 (N/A ) LYMPHADENECTOMY (Bilateral )  Patient Location: PACU  Anesthesia Type:General  Level of Consciousness: drowsy  Airway & Oxygen Therapy: Patient Spontanous Breathing and Patient connected to face mask oxygen  Post-op Assessment: Report given to RN and Post -op Vital signs reviewed and stable  Post vital signs: Reviewed and stable  Last Vitals:  Vitals Value Taken Time  BP 121/81 07/20/19 1400  Temp    Pulse 57 07/20/19 1403  Resp 14 07/20/19 1403  SpO2 100 % 07/20/19 1403  Vitals shown include unvalidated device data.  Last Pain:  Vitals:   07/20/19 0940  TempSrc: Oral      Patients Stated Pain Goal: 5 ([id number])  Complications: No apparent anesthesia complications

## 2019-07-20 NOTE — Progress Notes (Signed)
Patient ID: Rodney Hughes, male   DOB: 06/03/1950, 69 y.o.   MRN: [id number]  Post-op note  Subjective: The patient is doing well.  No complaints.  Objective: Vital signs in last 24 hours: Temp:  [97.6 F (36.4 C)-98.2 F (36.8 C)] 97.6 F (36.4 C) (03/25 1400) Pulse Rate:  [53-72] 53 (03/25 1445) Resp:  [10-18] 15 (03/25 1445) BP: (113-152)/(69-87) 120/76 (03/25 1445) SpO2:  [99 %-100 %] 100 % (03/25 1445) Weight:  [id number] kg] 119 kg (03/25 0953)  Intake/Output from previous day: No intake/output data recorded. Intake/Output this shift: Total I/O In: 1100 [I.V.:1000; IV Piggyback:100] Out: 100 [Blood:100]  Physical Exam:  General: Alert and oriented. Abdomen: Soft, Nondistended. Incisions: Clean and dry. GU: Urine clear.  Lab Results: Recent Labs    07/20/19 1425  HGB 11.9*  HCT 37.0*    Assessment/Plan: POD#0   1) Continue to monitor, ambulate, IS   Pryor Curia. MD   LOS: 0 days   Dutch Gray 07/20/2019, 3:01 PM

## 2019-07-20 NOTE — Anesthesia Procedure Notes (Signed)
Procedure Name: Intubation Date/Time: 07/20/2019 10:56 AM Performed by: Montel Clock, CRNA Pre-anesthesia Checklist: Patient identified, Emergency Drugs available, Suction available and Patient being monitored Patient Re-evaluated:Patient Re-evaluated prior to induction Oxygen Delivery Method: Circle system utilized Preoxygenation: Pre-oxygenation with 100% oxygen Induction Type: IV induction Ventilation: Two handed mask ventilation required and Oral airway inserted - appropriate to patient size Laryngoscope Size: Mac and 4 Grade View: Grade II Tube type: Oral Tube size: 7.5 mm Number of attempts: 1 Airway Equipment and Method: Stylet and Oral airway Placement Confirmation: ETT inserted through vocal cords under direct vision,  positive ETCO2 and breath sounds checked- equal and bilateral Secured at: 23 cm Tube secured with: Tape Dental Injury: Teeth and Oropharynx as per pre-operative assessment  Comments: Chip to left front tooth noted in pre-op

## 2019-07-21 DIAGNOSIS — C61 Malignant neoplasm of prostate: Secondary | ICD-10-CM | POA: Diagnosis not present

## 2019-07-21 LAB — GLUCOSE, CAPILLARY
Glucose-Capillary: 146 mg/dL — ABNORMAL HIGH (ref 70–99)
Glucose-Capillary: 127 mg/dL — ABNORMAL HIGH (ref 70–99)
Glucose-Capillary: 115 mg/dL — ABNORMAL HIGH (ref 70–99)
Glucose-Capillary: 97 mg/dL (ref 70–99)

## 2019-07-21 LAB — HEMOGLOBIN AND HEMATOCRIT, BLOOD
HCT: 33.8 % — ABNORMAL LOW (ref 39.0–52.0)
Hemoglobin: 11.2 g/dL — ABNORMAL LOW (ref 13.0–17.0)

## 2019-07-21 MED ORDER — BISACODYL 10 MG RE SUPP
10.0000 mg | Freq: Once | RECTAL | Status: AC
  Administered 2019-07-21: 10 mg via RECTAL
  Filled 2019-07-21: qty 1

## 2019-07-21 MED ORDER — TRAMADOL HCL 50 MG PO TABS
50.0000 mg | ORAL_TABLET | Freq: Four times a day (QID) | ORAL | Status: DC | PRN
  Administered 2019-07-21: 100 mg via ORAL
  Filled 2019-07-21: qty 2

## 2019-07-21 NOTE — Progress Notes (Signed)
Pt and wife educated about Foley care, how to empty and switch to a leg bag. Pt and wife verbalized understanding.

## 2019-07-21 NOTE — Plan of Care (Signed)
  Problem: Activity: Goal: Risk for activity intolerance will decrease Outcome: Adequate for Discharge   Problem: Elimination: Goal: Will not experience complications related to bowel motility Outcome: Adequate for Discharge Goal: Will not experience complications related to urinary retention Outcome: Adequate for Discharge   Problem: Pain Managment: Goal: General experience of comfort will improve Outcome: Adequate for Discharge   Problem: Elimination: Goal: Will not experience complications related to bowel motility Outcome: Adequate for Discharge   Problem: Pain Managment: Goal: General experience of comfort will improve Outcome: Adequate for Discharge

## 2019-07-21 NOTE — Progress Notes (Signed)
Patient ID: Rodney Hughes, male   DOB: 11/22/1950, 69 y.o.   MRN: [id number]  1 Day Post-Op Subjective: The patient is doing well.  No nausea or vomiting. Pain is adequately controlled.  Objective: Vital signs in last 24 hours: Temp:  [97.6 F (36.4 C)-98.5 F (36.9 C)] 97.8 F (36.6 C) (03/26 0656) Pulse Rate:  [53-72] 68 (03/26 0656) Resp:  [10-20] 18 (03/26 0656) BP: (113-152)/(69-87) 141/87 (03/26 0656) SpO2:  [96 %-100 %] 97 % (03/26 0656) Weight:  [id number] kg] 119 kg (03/25 0953)  Intake/Output from previous day: 03/25 0701 - 03/26 0700 In: 3955.9 [I.V.:2755.9; IV Piggyback:1200] Out: [id number] [Urine:2000; Drains:240; Blood:100] Intake/Output this shift: No intake/output data recorded.  Physical Exam:  General: Alert and oriented. CV: RRR Lungs: Clear bilaterally. GI: Soft, Nondistended. Incisions: Clean, dry, and intact Urine: Clear Extremities: Nontender, no erythema, no edema.  Lab Results: Recent Labs    07/20/19 1425 07/21/19 0525  HGB 11.9* 11.2*  HCT 37.0* 33.8*      Assessment/Plan: POD# 1 s/p robotic prostatectomy.  1) SL IVF 2) Ambulate, Incentive spirometry 3) Transition to oral pain medication 4) Dulcolax suppository 5) D/C pelvic drain 6) Plan for likely discharge later today   Rodney Curia. MD   LOS: 0 days   Dutch Gray 07/21/2019, 7:41 AM

## 2019-07-21 NOTE — Discharge Summary (Signed)
  Date of admission: 07/20/2019  Date of discharge: 07/21/2019  Admission diagnosis: Prostate Cancer  Discharge diagnosis: Prostate Cancer  History and Physical: For full details, please see admission history and physical. Briefly, Rodney Hughes is a 69 y.o. gentleman with localized prostate cancer.  After discussing management/treatment options, he elected to proceed with surgical treatment.  Hospital Course: Rodney Hughes was taken to the operating room on 07/20/2019 and underwent a robotic assisted laparoscopic radical prostatectomy. He tolerated this procedure well and without complications. Postoperatively, he was able to be transferred to a regular hospital room following recovery from anesthesia.  He was able to begin ambulating the night of surgery. He remained hemodynamically stable overnight.  He had excellent urine output with appropriately minimal output from his pelvic drain and his pelvic drain was removed on POD #1.  He was transitioned to oral pain medication, tolerated a clear liquid diet, and had met all discharge criteria and was able to be discharged home later on POD#1.  Laboratory values:  Recent Labs    07/20/19 1425 07/21/19 0525  HGB 11.9* 11.2*  HCT 37.0* 33.8*    Disposition: Home  Discharge instruction: He was instructed to be ambulatory but to refrain from heavy lifting, strenuous activity, or driving. He was instructed on urethral catheter care.  Discharge medications:   Allergies as of 07/21/2019   No Known Allergies     Medication List    STOP taking these medications   SUPER C-500 PO   Turmeric 500 MG Tabs     TAKE these medications   amLODipine 10 MG tablet Commonly known as: NORVASC TAKE 1 TABLET BY MOUTH EVERY DAY   glipiZIDE 5 MG tablet Commonly known as: GLUCOTROL Take 5 mg by mouth daily.   hydrALAZINE 25 MG tablet Commonly known as: APRESOLINE Take 25 mg by mouth 3 (three) times daily.   losartan 100 MG tablet Commonly known  as: COZAAR TAKE 1 TABLET BY MOUTH EVERY DAY   metFORMIN 500 MG 24 hr tablet Commonly known as: GLUCOPHAGE-XR Take 1,000 mg by mouth in the morning and at bedtime.   metoprolol succinate 100 MG 24 hr tablet Commonly known as: TOPROL-XL TAKE 1 TABLET BY MOUTH DAILY WITH OR IMMEDIATELY FOLLOWOING A MEAL What changed: See the new instructions.   rosuvastatin 10 MG tablet Commonly known as: CRESTOR Take 10 mg by mouth at bedtime.   sulfamethoxazole-trimethoprim 800-160 MG tablet Commonly known as: BACTRIM DS Take 1 tablet by mouth 2 (two) times daily. Start the day prior to foley removal appointment   traMADol 50 MG tablet Commonly known as: Ultram Take 1-2 tablets (50-100 mg total) by mouth every 6 (six) hours as needed for moderate pain or severe pain.       Followup: He will followup in 1 week for catheter removal and to discuss his surgical pathology results.

## 2019-07-25 LAB — SURGICAL PATHOLOGY

## 2019-08-08 DIAGNOSIS — C61 Malignant neoplasm of prostate: Secondary | ICD-10-CM | POA: Diagnosis not present

## 2019-08-21 DIAGNOSIS — M62838 Other muscle spasm: Secondary | ICD-10-CM | POA: Diagnosis not present

## 2019-08-21 DIAGNOSIS — N393 Stress incontinence (female) (male): Secondary | ICD-10-CM | POA: Diagnosis not present

## 2019-08-21 DIAGNOSIS — M6281 Muscle weakness (generalized): Secondary | ICD-10-CM | POA: Diagnosis not present

## 2019-09-02 IMAGING — CR DG CHEST 2V
2 series · 2 of 2 positions shown · non-contrast
Comparison: None.

CLINICAL DATA: Cough for 1 month, fever. Assess for pneumonia.
Nonsmoker. History of prostate cancer.

EXAM:
CHEST - 2 VIEW

[w chest pa]
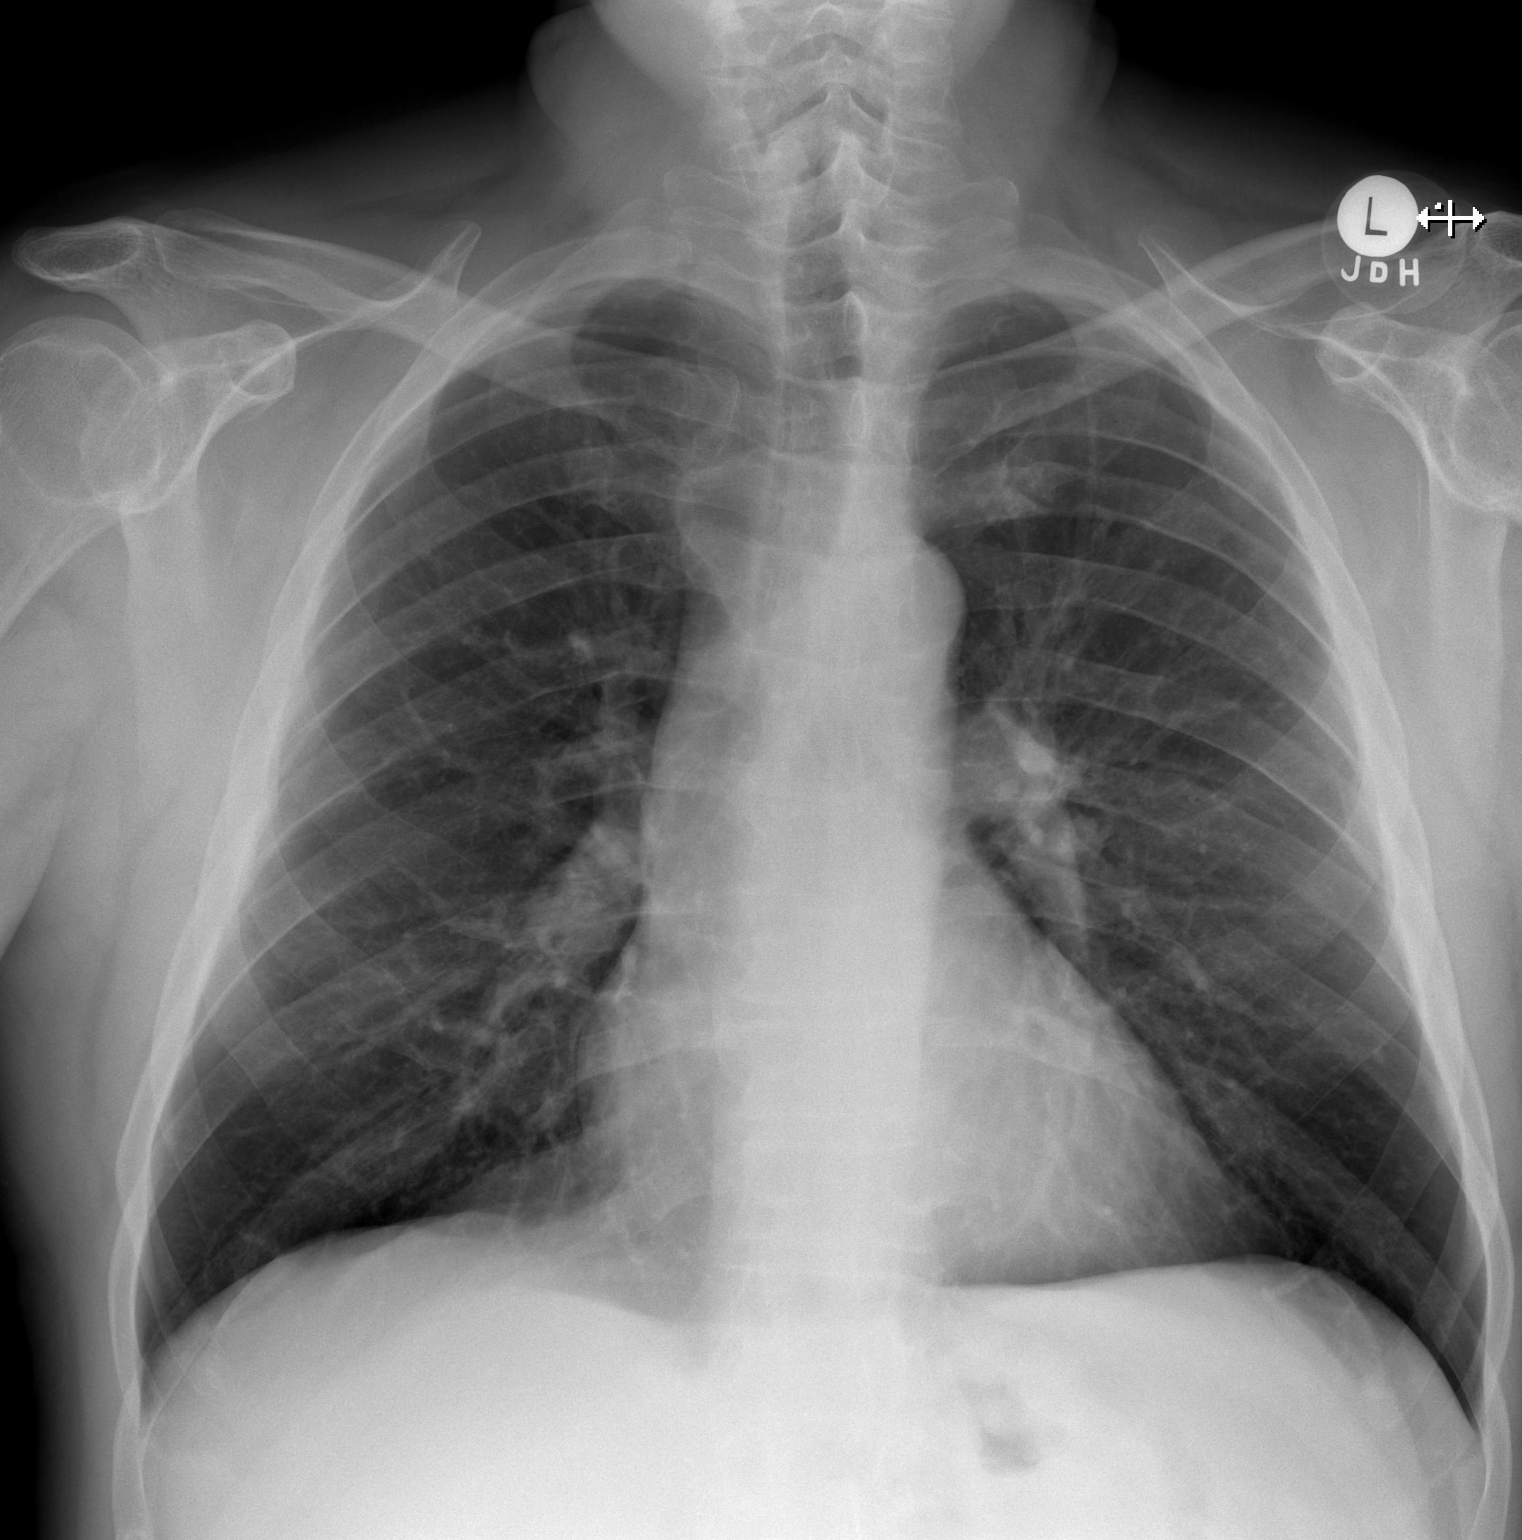

[w chest lat]
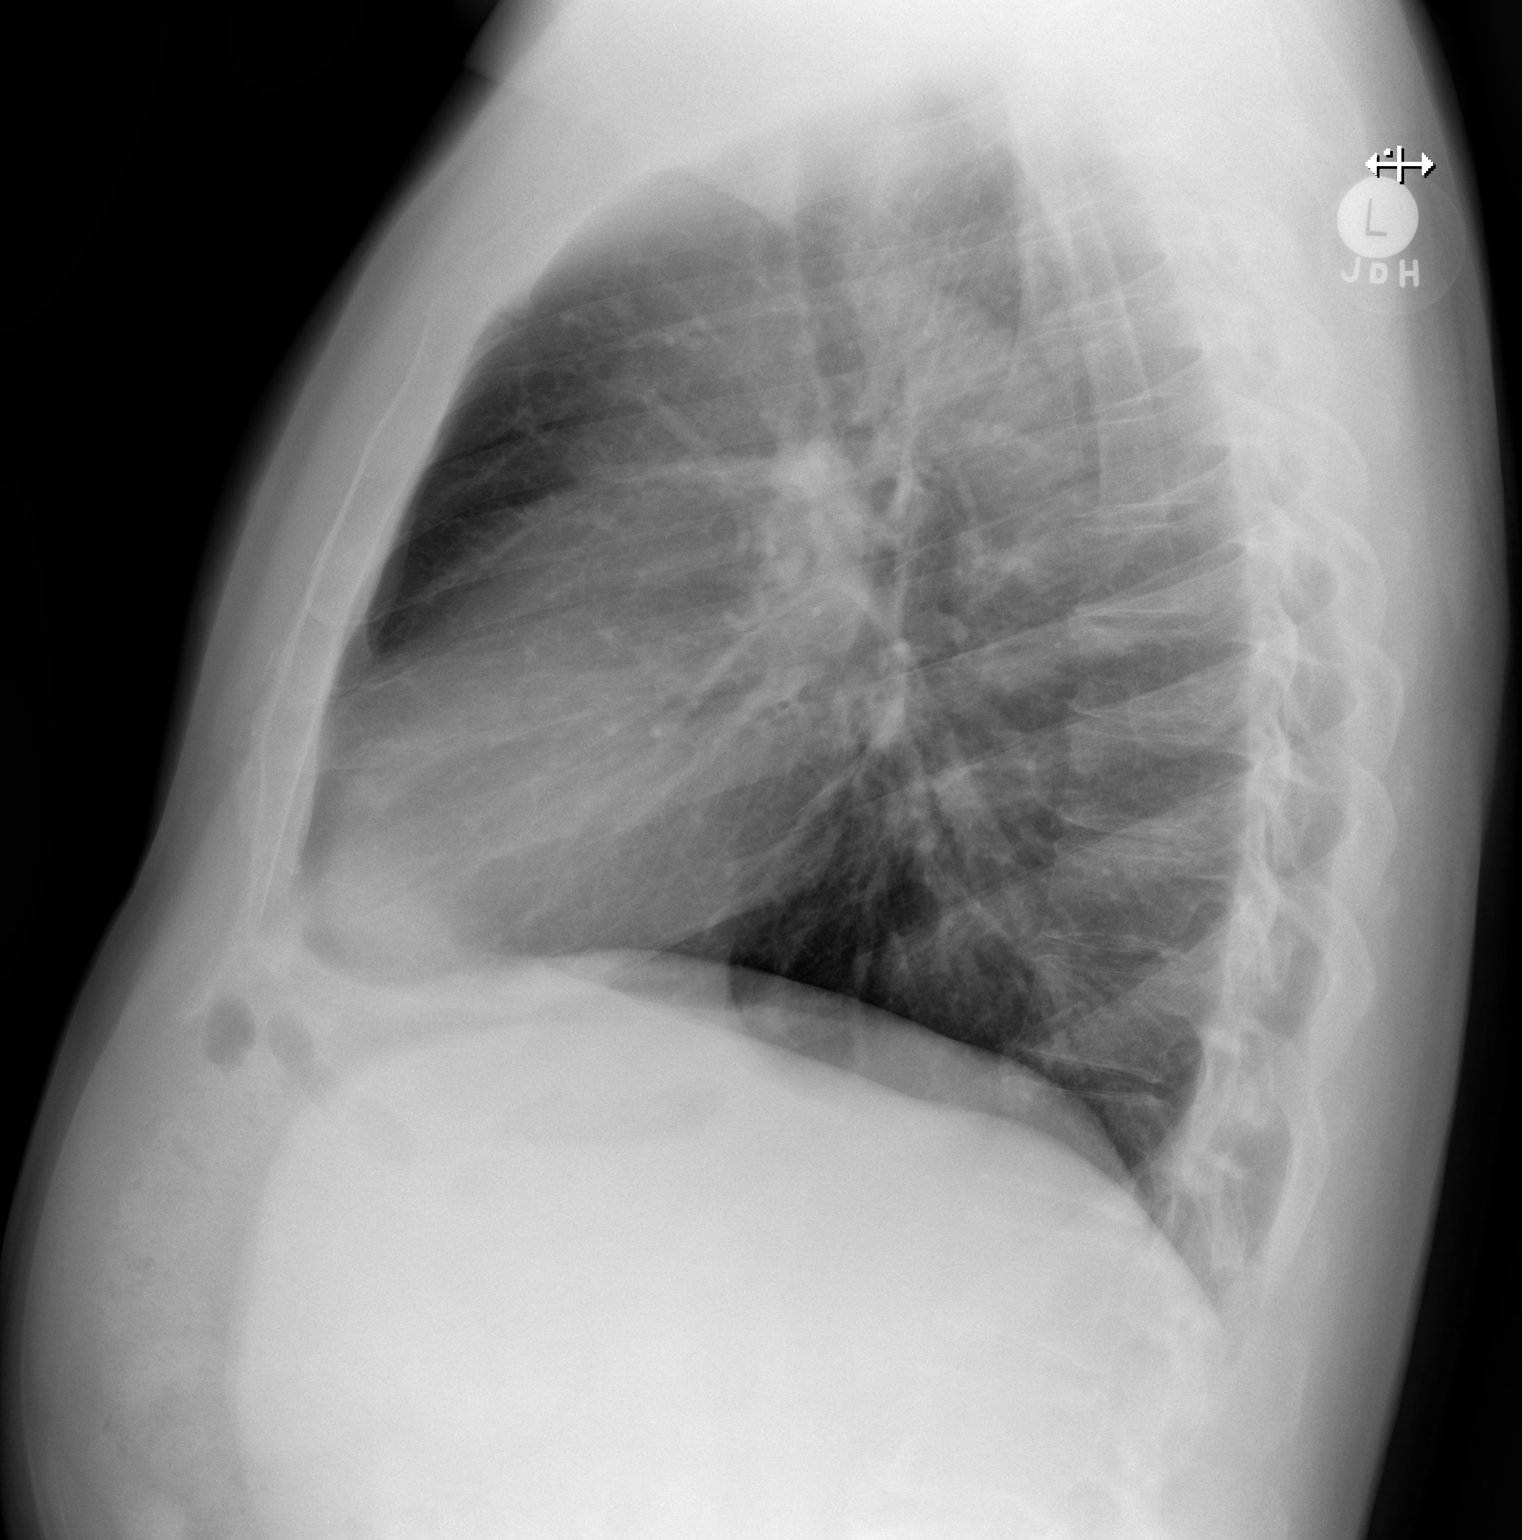

[2 of 2 positions shown; findings below may reference images not displayed]

FINDINGS: Cardiomediastinal silhouette is normal. No pleural effusions or
focal consolidations. Borderline hyperinflation. Trachea projects
midline and there is no pneumothorax. Soft tissue planes and
included osseous structures are non-suspicious.
IMPRESSION: Borderline hyperinflation without focal consolidation.

## 2019-10-13 DIAGNOSIS — C61 Malignant neoplasm of prostate: Secondary | ICD-10-CM | POA: Diagnosis not present

## 2019-10-18 DIAGNOSIS — G4733 Obstructive sleep apnea (adult) (pediatric): Secondary | ICD-10-CM | POA: Diagnosis not present

## 2019-10-18 DIAGNOSIS — M7989 Other specified soft tissue disorders: Secondary | ICD-10-CM | POA: Diagnosis not present

## 2019-10-18 DIAGNOSIS — E785 Hyperlipidemia, unspecified: Secondary | ICD-10-CM | POA: Diagnosis not present

## 2019-10-18 DIAGNOSIS — I10 Essential (primary) hypertension: Secondary | ICD-10-CM | POA: Diagnosis not present

## 2019-10-18 DIAGNOSIS — E1169 Type 2 diabetes mellitus with other specified complication: Secondary | ICD-10-CM | POA: Diagnosis not present

## 2019-10-20 DIAGNOSIS — N5201 Erectile dysfunction due to arterial insufficiency: Secondary | ICD-10-CM | POA: Diagnosis not present

## 2019-10-20 DIAGNOSIS — N393 Stress incontinence (female) (male): Secondary | ICD-10-CM | POA: Diagnosis not present

## 2019-10-20 DIAGNOSIS — C61 Malignant neoplasm of prostate: Secondary | ICD-10-CM | POA: Diagnosis not present

## 2019-10-26 ENCOUNTER — Telehealth: Payer: Self-pay | Admitting: *Deleted

## 2019-10-26 NOTE — Telephone Encounter (Signed)
LVM for call back to schedule appointment with Dr. Tammi Klippel. Received referral from [facility] - Dr. Alinda Money.

## 2019-11-07 ENCOUNTER — Ambulatory Visit
Admission: RE | Admit: 2019-11-07 | Discharge: 2019-11-07 | Disposition: A | Discharge status: Home / Self Care | Payer: PPO | Source: Ambulatory Visit | Attending: Radiation Oncology | Admitting: Radiation Oncology

## 2019-11-07 ENCOUNTER — Encounter: Payer: Self-pay | Admitting: Radiation Oncology

## 2019-11-07 ENCOUNTER — Other Ambulatory Visit: Payer: Self-pay

## 2019-11-07 VITALS — Ht 74.0 in | Wt 256.0 lb

## 2019-11-07 DIAGNOSIS — C61 Malignant neoplasm of prostate: Secondary | ICD-10-CM

## 2019-11-07 DIAGNOSIS — R9721 Rising PSA following treatment for malignant neoplasm of prostate: Secondary | ICD-10-CM | POA: Diagnosis not present

## 2019-11-07 DIAGNOSIS — Z9079 Acquired absence of other genital organ(s): Secondary | ICD-10-CM | POA: Diagnosis not present

## 2019-11-07 NOTE — Progress Notes (Signed)
Radiation Oncology         [phone] ________________________________  Initial Outpatient Consultation - Conducted via Telephone due to current COVID-19 concerns for limiting patient exposure  Name: Rodney Hughes MRN: [id number]  Date: 11/07/2019  DOB: 1950/09/27  HG:[id number], Domingo Pulse, Christin Fudge, MD   REFERRING PHYSICIAN: Raynelle Bring, MD  DIAGNOSIS: 69 y.o. 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    ICD-10-CM   1. Prostate cancer [facility])  C61     HISTORY OF PRESENT ILLNESS: Rodney Hughes is a 69 y.o. male with a diagnosis of prostate cancer. He was initially found to have Gleason 3+3 adenocarcinoma the prostate on biopsy with Dr. Louis Meckel in 08/2013 with a PSA of 4.9 at the time of diagnosis. He wisely elected to proceed in AS and a repeat surveillance biopsy in 06/2015 confirmed stable, Gleason 3+3 disease with a PSA at 6.16 at that time. The patient elected to continue in AS and PSA continued to gradually increase with some fluctuation over the years.   The PSA was 7.3 in January 2019 and a surveillance prostate MRI was performed on 06/25/2017 confirming no findings to suggest high grade or macroscopic prostate carcinoma.  His PSA further elevated to 8.73 in November 2020 so a third surveillance prostate biopsy was performed on 06/05/2019, this time confirming an upstaging of his disease with Gleason 4+3 and 4 of 12 cores.  The patient elected to proceed with robotic prostatectomy which was performed on 07/20/2019 under the care of Dr. Alinda Money.  Final surgical pathology [id number]) revealed pT3aN0, gleason 3+5=8 adenocarcinoma of the prostate with multiple adverse findings.  There were multiple bilateral positive with extraprostatic extension at the left lateral mid (81mm Gleason score 4+3=7) and the right anterior base near the bladder neck (26mm, cauterized glands, Gleason score 3+4=7). There  were 8 benign lymph nodes and no evidence of seminal vesicle involvement.  Fortunately, his postoperative PSA performed on 10/17/2019 is undetectable.  He has regained full bladder control and is no longer requiring pads for protection at this point.  The patient reviewed the surgical pathology results with his urologist and he has kindly been referred today for discussion of potential radiation treatment options.  His wife, Rodney Hughes, is accompanying him, via conference call today.  PREVIOUS RADIATION THERAPY: No   PAST MEDICAL HISTORY:  Past Medical History:  Diagnosis Date  . Diabetes mellitus without complication ([place])    Pre-diabetic per pt  . Gout   . Hypertension   . OSA (obstructive sleep apnea)    on c-pap  . Prostate cancer ([place])    low grade per pt/ since 20015/no surg/had biopsy 06/2015      PAST SURGICAL HISTORY: Past Surgical History:  Procedure Laterality Date  . KNEE SURGERY     right knee  . LYMPHADENECTOMY Bilateral 07/20/2019   Procedure: LYMPHADENECTOMY;  Surgeon: Raynelle Bring, MD;  Location: [facility];  Service: Urology;  Laterality: Bilateral;  . POLYPECTOMY    . PROSTATE BIOPSY     06/2015  . ROBOT ASSISTED LAPAROSCOPIC RADICAL PROSTATECTOMY N/A 07/20/2019   Procedure: XI ROBOTIC ASSISTED LAPAROSCOPIC RADICAL PROSTATECTOMY LEVEL 2;  Surgeon: Raynelle Bring, MD;  Location: [facility];  Service: Urology;  Laterality: N/A;  . TONSILLECTOMY      FAMILY HISTORY:  Family History  Problem Relation Age of Onset  . Hypertension Father   . Diabetes Father   . Allergies Father   .  Breast cancer Neg Hx   . Prostate cancer Neg Hx   . Colon cancer Neg Hx   . Pancreatic cancer Neg Hx     SOCIAL HISTORY:  Social History   Socioeconomic History  . Marital status: Married    Spouse name: Rodney Hughes  . Number of children: 3  . Years of education: Not on file  . Highest education level: Not on file  Occupational History  . Occupation: Landscaping work    Comment: retired   Tobacco Use  . Smoking status: Never Smoker  . Smokeless tobacco: Never Used  Vaping Use  . Vaping Use: Never used  Substance and Sexual Activity  . Alcohol use: Yes    Alcohol/week: 0.0 standard drinks    Comment: only occ  . Drug use: No  . Sexual activity: Not Currently  Other Topics Concern  . Not on file  Social History Narrative  . Not on file   Social Determinants of Health   Financial Resource Strain:   . Difficulty of Paying Living Expenses:   Food Insecurity:   . Worried About Charity fundraiser in the Last Year:   . Arboriculturist in the Last Year:   Transportation Needs:   . Film/video editor (Medical):   [name] Lack of Transportation (Non-Medical):   Physical Activity:   . Days of Exercise per Week:   . Minutes of Exercise per Session:   Stress:   . Feeling of Stress :   Social Connections:   . Frequency of Communication with Friends and Family:   . Frequency of Social Gatherings with Friends and Family:   . Attends Religious Services:   . Active Member of Clubs or Organizations:   . Attends Archivist Meetings:   [name] Marital Status:   Intimate Partner Violence:   . Fear of Current or Ex-Partner:   . Emotionally Abused:   [name] Physically Abused:   . Sexually Abused:     ALLERGIES: Patient has no known allergies.  MEDICATIONS:  Current Outpatient Medications  Medication Sig Dispense Refill  . amLODipine (NORVASC) 10 MG tablet TAKE 1 TABLET BY MOUTH EVERY DAY (Patient taking differently: Take 10 mg by mouth daily. ) 90 tablet 1  . glipiZIDE (GLUCOTROL) 5 MG tablet Take 5 mg by mouth daily.    . hydrALAZINE (APRESOLINE) 25 MG tablet Take 25 mg by mouth 3 (three) times daily.    [name] losartan (COZAAR) 100 MG tablet TAKE 1 TABLET BY MOUTH EVERY DAY (Patient taking differently: Take 100 mg by mouth daily. ) 30 tablet 5  . metFORMIN (GLUCOPHAGE-XR) 500 MG 24 hr tablet Take 1,000 mg by mouth in the morning and at bedtime.     . metoprolol succinate  (TOPROL-XL) 100 MG 24 hr tablet TAKE 1 TABLET BY MOUTH DAILY WITH OR IMMEDIATELY FOLLOWOING A MEAL (Patient taking differently: Take 100 mg by mouth daily. ) 90 tablet 1  . rosuvastatin (CRESTOR) 10 MG tablet Take 10 mg by mouth at bedtime.     No current facility-administered medications for this encounter.    REVIEW OF SYSTEMS:  On review of systems, the patient reports that he is doing well overall. He denies any chest pain, shortness of breath, cough, fevers, chills, night sweats, or unintended weight changes. He denies any bowel disturbances, and denies abdominal pain, nausea or vomiting. He denies any new musculoskeletal or joint aches or pains. His IPSS was 1, indicating minimal urinary symptoms. 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 A complete review of systems is obtained and is otherwise negative.     PHYSICAL EXAM:  Wt Readings from Last 3 Encounters:  11/07/19 256 lb (116.1 kg)  07/20/19 262 lb 5 oz (119 kg)  07/10/19 262 lb 5 oz (119 kg)   Temp Readings from Last 3 Encounters:  07/21/19 98.1 F (36.7 C) (Oral)  07/10/19 98.6 F (37 C) (Oral)  07/30/15 99.3 F (37.4 C)   BP Readings from Last 3 Encounters:  07/21/19 (!) 151/82  07/10/19 (!) 172/96  08/20/15 (!) 142/82   Pulse Readings from Last 3 Encounters:  07/21/19 71  07/10/19 69  08/20/15 (!) 58   Pain Assessment Pain Score: 0-No pain/10  Physical exam not performed in light of telephone consult visit format.   KPS = 100  100 - Normal; no complaints; no evidence of disease. 90   - Able to carry on normal activity; minor signs or symptoms of disease. 80   - Normal activity with effort; some signs or symptoms of disease. 32   - Cares for self; unable to carry on normal activity or to do active work. 60   - Requires occasional assistance, but is able to care for most of his personal needs. 50   - Requires considerable assistance and frequent medical care. 26   -  Disabled; requires special care and assistance. 53   - Severely disabled; hospital admission is indicated although death not imminent. 61   - Very sick; hospital admission necessary; active supportive treatment necessary. 10   - Moribund; fatal processes progressing rapidly. 0     - Dead  Karnofsky DA, Abelmann Follett, Craver LS and Burchenal [facility] [phone]) The use of the nitrogen mustards in the palliative treatment of carcinoma: with particular reference to bronchogenic carcinoma Cancer 1 634-56  LABORATORY DATA:  Lab Results  Component Value Date   WBC 7.9 07/10/2019   HGB 11.2 (L) 07/21/2019   HCT 33.8 (L) 07/21/2019   MCV 88.9 07/10/2019   PLT 169 07/10/2019   Lab Results  Component Value Date   NA 142 07/10/2019   K 4.4 07/10/2019   CL 104 07/10/2019   CO2 29 07/10/2019   Lab Results  Component Value Date   ALT 18 06/12/2014   AST 16 06/12/2014   ALKPHOS 57 06/12/2014   BILITOT 0.7 06/12/2014     RADIOGRAPHY: No results found.    IMPRESSION/PLAN: This visit was conducted via Telephone to spare the patient unnecessary potential exposure in the healthcare setting during the current COVID-19 pandemic. 1. 69 y.o. gentleman with Stage T3a adenocarcinoma of the prostate with Gleason Score of 3+3, and PSA of 4.9.  Today we reviewed the findings and workup thus far.  We discussed the natural history of prostate cancer.  We reviewed the the implications of positive margins, extracapsular extension, and seminal vesicle involvement on the risk of prostate cancer recurrence. We reviewed some of the evidence suggesting an advantage for patients who undergo adjuvant radiotherapy in the setting in terms of disease control and overall survival. We also discussed some of the dilemmas related to the available evidence.  We discussed the SWOG trial which did show an improvement in disease-free survival as well as overall survival using adjuvant radiotherapy. However, we discussed the fact that the  study did not carefully control the usage of adjuvant radiotherapy in the observation arm. There is increasing evidence that careful surveillance with ultrasensitive PSA may provide an  opportunity for early salvage in patients who undergo observation, which can lead to excellent results in terms of disease control and survival. We discussed radiation treatment directed to the prostatic fossa with regard to the logistics and delivery of external beam radiation treatment.   The patient would like to proceed with PSA surveillance for now, with plans to move forward with a 7-1/2-week course of adjuvant radiotherapy to the prostate fossa in the fall.  We recommend a repeat PSA with Dr. Alinda Money in September or October 2021, to establish a current baseline PSA prior to proceeding with adjuvant radiation.  We will plan to touch base with him in early October 2021 but he has our contact information and will reach out to [place] if he is ready to proceed sooner.  We will share our findings with Dr. Alinda Money and will look forward to following along in his progress.     We enjoyed meeting with him and his wife today, and will look forward to participating in the care of this very nice gentleman in the near future.   Given current concerns for patient exposure during the COVID-19 pandemic, this encounter was conducted via telephone. The patient was notified in advance and was offered a MyChart meeting to allow for face to face communication but unfortunately reported that he did not have the appropriate resources/technology to support such a visit and instead preferred to proceed with telephone consult. The patient has given verbal consent for this type of encounter. The time spent during this encounter was 60 minutes.  The attendants for this meeting include Tyler Pita MD, [address] PA-C, [employer], scribe, and patient, [name] During the encounter, Tyler Pita MD, Ashlyn Bruning PA-C, and scribe, [employer] were located at [place].  Patient, Rodney Hughes was located at home.     Nicholos Johns, PA-C    Tyler Pita, MD  [facility] Direct Dial: [phone]  Fax: [phone] [email or web address]  Skype  [email or web address]  This document serves as a record of services personally performed by Tyler Pita, MD and Freeman Caldron, PA-C. It was created on their behalf by [employer], a trained medical scribe. The creation of this record is based on the scribe's personal observations and the provider's statements to them. This document has been checked and approved by the attending provider.     References:  JAMA. 2006 Nov 15;296(19):2329-35.  Adjuvant radiotherapy for pathologically advanced prostate cancer: a randomized clinical trial.  Grandville Silos IM Jr(1), Tangen CM, Hettie Holstein MS, Ova Freshwater, Messing Wayland Denis ED.  Author information: (1)Department of Urology, [facility] [place], [place], [address], [place] [address], [place]. [email or web address]  CONTEXT: Despite a stage-shift to earlier cancer stages and lower tumor volumes for prostate cancer, pathologically advanced disease is detected at radical prostatectomy in 38% to 52% of patients. However, the optimal management of these patients after radical prostatectomy is unknown.  OBJECTIVE: To determine whether adjuvant radiotherapy improves metastasis-free survival in patients with stage pT3 N0 M0 prostate cancer.  DESIGN, SETTING, AND PATIENTS: Randomized, prospective, multi-institutional, [place] clinical trial with enrollment between December 10, 1986, and April 28, 1995 (with database frozen for statistical analysis on January 16, 2004). Patients were 88 men with pathologically advanced prostate cancer who had undergone radical prostatectomy. INTERVENTION: Men  were randomly assigned to receive 60 to  64 Gy of external beam radiotherapy delivered to the prostatic fossa (n = 214) or usual care plus observation (n = 211).  MAIN OUTCOME MEASURES: Primary outcome was metastasis-free survival, defined as time to first occurrence of metastatic disease or death due to any cause. Secondary outcomes included prostate-specific antigen (PSA) relapse, recurrence-free survival, overall survival, freedom from hormonal therapy, and postoperative complications.  RESULTS: Among the 425 men, median follow-up was 10.6 years (interquartile range, 9.2-12.7 years). For metastasis-free survival, 76 (35.5%) of 214 men in the adjuvant radiotherapy group were diagnosed with metastatic disease or died (median metastasis-free estimate, 14.7 years), compared with 91 (43.1%) of 211 (median metastasis-free estimate, 13.2 years) of those in the observation group (hazard ratio [HR], 0.75; 95% CI, 0.55-1.02; P = .06). There were no significant between-group differences for overall survival (71 deaths, median survival of 14.7 years for radiotherapy vs 83 deaths, median survival of 13.8 years for observation; HR, 0.80; 95% CI, 0.58-1.09; P = .16). PSA relapse (median PSA relapse-free survival, 10.3 years for radiotherapy vs 3.1 years for observation; HR, 0.43; 95% CI, 0.31-0.58; P<.001) and disease recurrence (median recurrence-free survival, 13.8 years for radiotherapy vs 9.9 years for observation; HR, 0.62; 95% CI, 0.46-0.82; P = .001) were both significantly reduced with radiotherapy. Adverse effects were more common with radiotherapy vs observation (23.8% vs 11.9%), including rectal complications (8.4% vs 0%), urethral strictures (17.8% vs 9.5%), and total urinary incontinence (6.5% vs 2.8%).  CONCLUSIONS: In men who had undergone radical prostatectomy for pathologically advanced prostate cancer, adjuvant radiotherapy resulted in significantly reduced risk of PSA relapse and disease recurrence,  although the improvements in metastasis-free survival and overall survival were not statistically significant.  Trial Registration clinicaltrials.gov Identifier: [id number]. PMID: [id number]   J Clin Oncol. 2007 Jun 1;25(16):2225-9. Predominant treatment failure in postprostatectomy patients is local: analysis of patterns of treatment failure in SWOG 8794.  Swanson GP(1), Corinna MA, Tangen CM, Chin J, Messing E, Canby-Hagino Daiva Eves, Doy Hutching ED;  Author information: (1)Department of Radiation Oncology and Urology, [facility], [place] [address], [place]. [email or web address] Comment in [place]. 2007 Dec 10;25(35):5671-2.  PURPOSE: [facility] (SWOG) trial [phone] demonstrated that adjuvant radiation reduces the risk of biochemical (prostate-specific antigen [PSA]) treatment failure by 50% over radical prostatectomy alone. In this analysis, we stratified patients as to their preradiation PSA levels and correlated it with outcomes such as PSA treatment failure, local recurrence, and distant failure, to serve as guidelines for future research.  PATIENTS AND METHODS: Four hundred thirty-one subjects with pathologically advanced prostate cancer (extraprostatic extension, positive surgical margins, or seminal vesicle invasion) were randomly assigned to adjuvant radiotherapy or observation.  RESULTS: Three hundred seventy-four eligible patients had immediate postprostatectomy and follow-up PSA data. Median follow-up was 10.2 years. For patients with a postsurgical PSA of 0.2 ng/mL, radiation was associated with reductions in the 10-year risk of biochemical treatment failure (72% to 42%), local failures (20% to 7%), and distant failures (12% to 4%). For patients with a postsurgical PSA between higher than 0.2 and <or = 1.0 ng/mL, reductions in the 10-year risk of biochemical failure (80% to 73%), local failures (25% to 9%), and distant failures (16%  to 12%) were realized. In patients with postsurgical PSA higher than 1.0, the respective findings were 94% versus 100%, 28% versus 9%, and 44% versus 18%.  CONCLUSION: The pattern of treatment failure in high-risk patients is predominantly local with a surprisingly low incidence of metastatic failure. Adjuvant  radiation to the prostate bed reduces the risk of metastatic disease and biochemical failure at all postsurgical PSA levels. Further improvement in reducing local treatment failure is likely to have the greatest impact on outcome in high-risk patients after prostatectomy.  PMID: [id number]     J Urol. 2009 Mar;181(3):956-62.  Adjuvant radiotherapy for pathological T3N0M0 prostate cancer significantly reduces risk of metastases and improves survival: long-term followup of a randomized clinical trial.  Grandville Silos IM(1), Tangen CM, Hettie Holstein MS, Ova Freshwater, Messing [facility].  Chief Strategy Officer information: (1)[employer] [facility] [place], [place], [place], [place]. PURPOSE: Extraprostatic disease will be manifest in a third of men after radical prostatectomy. We present the long-term followup of a randomized clinical trial of radiotherapy to reduce the risk of subsequent metastatic disease and death.  MATERIALS AND METHODS: A total of 431 men with pT3N0M0 prostate cancer were randomized to 60 to 64 Gy adjuvant radiotherapy or observation. The primary study end point was metastasis-free survival.  RESULTS: Of 425 eligible men 211 were randomized to observation and 214 to adjuvant radiation. Of those men under observation 70 ultimately received radiotherapy. Metastasis-free survival was significantly greater with radiotherapy (93 of 214 events on the radiotherapy arm vs 114 of 211 events on observation; HR 0.71; 95% CI 0.54, 0.94; p = 0.016). Survival improved significantly with adjuvant radiation (88 deaths of 214 on the  radiotherapy arm vs 110 deaths of 211 on observation; HR 0.72; 95% CI 0.55, 0.96; p = 0.023).  CONCLUSIONS: Adjuvant radiotherapy after radical prostatectomy for a man with pT3N0M0 prostate cancer significantly reduces the risk of metastasis and increases survival.  PMCID: [id number] PMID: [id number]

## 2019-11-07 NOTE — Progress Notes (Signed)
GU Location of Tumor / Histology: prostatic adenocarcinoma   If Prostate Cancer, Gleason Score is (3 + 5) and PSA is (8.73)  Rodney Hughes had his initial prostate biopsy in March 2017 revealing a gleason score of 3+3 in 2 of 12 cores. Patient opted for active surveillance at that time. Unfortunately, repeat biopsy November 2020 reveal progression.  FINAL MICROSCOPIC DIAGNOSIS:   A. PROSTATE, RADICAL PROSTATECTOMY:  Prostatic adenocarcinoma, Gleason score 3+5=8 (grade group 4)  Extraprostatic extension is identified at the left anterior mid and  bladder neck (pT3a)  Margins of resection is focally positive at  left lateral mid (1 mm, Gleason score 4+3=7)  right anterior base near bladder neck (2 mm, cauterized glands, Gleason  score 3+4=7)   seminal vesicles, vasa deferentia and other resection margins are  negative for tumor   B. LYMPH NODE, RIGHT PELVIC, EXCISION:  Five benign lymph nodes (0/5)   C. LYMPH NODE, LEFT PELVIC, EXCISION:  Three benign lymph nodes (0/3)   PROSTATE GLAND:  Procedure: Radical Prostatectomy  Histologic Type: Acinar  Histologic Grade, Grade Group and Gleason Score: 3+5=8 (grade group 4)  Percentage of Pattern 4 in Gleason Score 7 (3+4, 4+3) Cancer (reportif  applicable): NA  Extraprostatic Extension: Present  Urinary Bladder Neck Invasion: Present  Seminal Vesicle Invasion: Negative  Margins: Focally positive  Treatment Effect: Negative  Regional Lymph Nodes:      No lymph nodes submitted or found: NA      Number of Lymph Nodes Involved: 0      Number of Lymph Nodes Examined: 8  Pathologic Stage Classification (pTNM, AJCC 8th Edition): pT3a, pN0  Representative Tumor Block: A10  Comment(s):  Dominant nodule: Gleason score 3+5=8 extensively involves bilateral  anterior from apex to base of the gland (pattern 3=60%, pattern 5=20%,  pattern 4=20%)  Secondary nodule: Gleason score 4+3=7 involves right posterior at the  mid portion  of the gland.  Tumor volume: about 40%   Past/Anticipated interventions by urology, if any: prostate biopsy, active surveillance, prostate biopsy, prostatectomy, referral to Dr. Tammi Klippel to discuss radiation therapy options.   Past/Anticipated interventions by medical oncology, if any: no  Weight changes, if any: no  Bowel/Bladder complaints, if any: IPSS 1. SHIM 3. Reports he is able to achieve an erection but hasn't attempted sexual intercourse since his prostatectomy. Denies dysuria or hematuria. Reports he has rare leakage with strenuous activity thus wears a thin liner daily.    Nausea/Vomiting, if any: no  Pain issues, if any:  no  SAFETY ISSUES:  Prior radiation? no  Pacemaker/ICD? no  Possible current pregnancy? no  Is the patient on methotrexate? no  Current Complaints / other details:  69 year old male. Married to [employer]. Three children. Resides in [place].

## 2019-11-21 ENCOUNTER — Encounter: Payer: Self-pay | Admitting: Medical Oncology

## 2019-11-21 DIAGNOSIS — E1169 Type 2 diabetes mellitus with other specified complication: Secondary | ICD-10-CM | POA: Diagnosis not present

## 2019-11-21 DIAGNOSIS — I10 Essential (primary) hypertension: Secondary | ICD-10-CM | POA: Diagnosis not present

## 2019-11-21 DIAGNOSIS — R7989 Other specified abnormal findings of blood chemistry: Secondary | ICD-10-CM | POA: Diagnosis not present

## 2019-11-21 DIAGNOSIS — E785 Hyperlipidemia, unspecified: Secondary | ICD-10-CM | POA: Diagnosis not present

## 2019-11-21 DIAGNOSIS — C61 Malignant neoplasm of prostate: Secondary | ICD-10-CM | POA: Diagnosis not present

## 2020-01-16 ENCOUNTER — Telehealth: Payer: Self-pay | Admitting: Radiation Oncology

## 2020-01-16 NOTE — Telephone Encounter (Signed)
Received voicemail message from patient requesting to move forward with radiation therapy. Phoned patient back to confirm receipt of his message. Spoke with patient and wife over speaker phone. Wife explains that "they would like to start radiation the last week of October." Explained a repeat PSA will need to be done by Dr. Alinda Money before radiation begins per consult note. Explained that this RN will inform prostate navigator, Cira Rue, and scheduler, Romie Jumper, of these findings to coordination can begin. Encouraged them to call back with future needs or questions. Both verbalized understanding.   Snip it of consult note. The patient would like to proceed with PSA surveillance for now, with plans to move forward with a 7-1/2-week course of adjuvant radiotherapy to the prostate fossa in the fall.  We recommend a repeat PSA with Dr. Alinda Money in September or October 2021, to establish a current baseline PSA prior to proceeding with adjuvant radiation.  We will plan to touch base with him in early October 2021 but he has our contact information and will reach out to [place] if he is ready to proceed sooner.  We will share our findings with Dr. Alinda Money and will look forward to following along in his progress.

## 2020-01-17 ENCOUNTER — Encounter: Payer: Self-pay | Admitting: Urology

## 2020-01-17 DIAGNOSIS — G4733 Obstructive sleep apnea (adult) (pediatric): Secondary | ICD-10-CM | POA: Diagnosis not present

## 2020-01-17 DIAGNOSIS — Z9989 Dependence on other enabling machines and devices: Secondary | ICD-10-CM | POA: Diagnosis not present

## 2020-01-17 DIAGNOSIS — I10 Essential (primary) hypertension: Secondary | ICD-10-CM | POA: Diagnosis not present

## 2020-01-17 NOTE — Progress Notes (Signed)
Patient is scheduled for a repeat PSA with Dr. Alinda Money 01/22/20 and would like to proceed with adjuvant prostate fossa radiation, to begin in the last week of October so I have put an order in [place] requesting CT SIM reach out to patient to schedule his planning appointment the week of 02/12/20 in anticipation of beginning his daily treatments shortly thereafter.  Nicholos Johns, MMS, PA-C [facility]: [phone]  Fax: [phone]

## 2020-01-17 NOTE — Telephone Encounter (Signed)
I just got a message from Cira Rue yesterday that he is scheduled for repeat PSA with Dr. Alinda Money on 01/22/20 so I will put in a SIM order to get the ball rolling. Thank you all!!!!! -Pranavi Aure

## 2020-01-22 DIAGNOSIS — C61 Malignant neoplasm of prostate: Secondary | ICD-10-CM | POA: Diagnosis not present

## 2020-02-07 ENCOUNTER — Telehealth: Payer: Self-pay | Admitting: Radiation Oncology

## 2020-02-07 NOTE — Telephone Encounter (Signed)
Received voicemail message from patient that his PSA has been checked and he is ready to start radiation. Phoned patient back. Confirmed his CT simulation appointment on February 12, 2020 at 0900. Reassured patient everything is on track for him to start radiation therapy. Patient verbalized understanding and appreciation for the call back.

## 2020-02-12 ENCOUNTER — Other Ambulatory Visit: Payer: Self-pay

## 2020-02-12 ENCOUNTER — Ambulatory Visit
Admission: RE | Admit: 2020-02-12 | Discharge: 2020-02-12 | Disposition: A | Discharge status: Home / Self Care | Payer: PPO | Source: Ambulatory Visit | Attending: Radiation Oncology | Admitting: Radiation Oncology

## 2020-02-12 ENCOUNTER — Encounter: Payer: Self-pay | Admitting: Medical Oncology

## 2020-02-12 DIAGNOSIS — Z510 Encounter for antineoplastic radiation therapy: Secondary | ICD-10-CM | POA: Insufficient documentation

## 2020-02-12 DIAGNOSIS — C61 Malignant neoplasm of prostate: Secondary | ICD-10-CM | POA: Diagnosis not present

## 2020-02-12 NOTE — Progress Notes (Signed)
  Radiation Oncology         [phone] ________________________________  Name: Rodney Hughes MRN: [id number]  Date: 02/12/2020  DOB: 03/27/1951  SIMULATION AND TREATMENT PLANNING NOTE    ICD-10-CM   1. Prostate cancer ([place])  C61     DIAGNOSIS:  69 y.o. 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  NARRATIVE:  The patient was brought to the [facility].  Identity was confirmed.  All relevant records and images related to the planned course of therapy were reviewed.  The patient freely provided informed written consent to proceed with treatment after reviewing the details related to the planned course of therapy. The consent form was witnessed and verified by the simulation staff.  Then, the patient was set-up in a stable reproducible supine position for radiation therapy.  A vacuum lock pillow device was custom fabricated to position his legs in a reproducible immobilized position.  Then, I performed a urethrogram under sterile conditions to identify the prostatic bed.  CT images were obtained.  Surface markings were placed.  The CT images were loaded into the planning software.  Then the prostate bed target, pelvic lymph node target and avoidance structures including the rectum, bladder, bowel and hips were contoured.  Treatment planning then occurred.  The radiation prescription was entered and confirmed.  A total of one complex treatment devices were fabricated. I have requested : Intensity Modulated Radiotherapy (IMRT) is medically necessary for this case for the following reason:  Rectal sparing.[name]  PLAN:  The patient will receive 45 Gy in 25 fractions of 1.8 Gy, followed by a boost to the prostate bed to a total dose of 68.4 Gy with 13 additional fractions of 1.8 Gy.   ________________________________  Sheral Apley Tammi Klippel, M.D.

## 2020-02-19 DIAGNOSIS — R0902 Hypoxemia: Secondary | ICD-10-CM | POA: Diagnosis not present

## 2020-02-19 DIAGNOSIS — R0683 Snoring: Secondary | ICD-10-CM | POA: Diagnosis not present

## 2020-02-19 DIAGNOSIS — Z9989 Dependence on other enabling machines and devices: Secondary | ICD-10-CM | POA: Diagnosis not present

## 2020-02-19 DIAGNOSIS — I10 Essential (primary) hypertension: Secondary | ICD-10-CM | POA: Diagnosis not present

## 2020-02-19 DIAGNOSIS — G4733 Obstructive sleep apnea (adult) (pediatric): Secondary | ICD-10-CM | POA: Diagnosis not present

## 2020-02-20 DIAGNOSIS — C61 Malignant neoplasm of prostate: Secondary | ICD-10-CM | POA: Diagnosis not present

## 2020-02-20 DIAGNOSIS — Z510 Encounter for antineoplastic radiation therapy: Secondary | ICD-10-CM | POA: Diagnosis not present

## 2020-02-22 ENCOUNTER — Ambulatory Visit
Admission: RE | Admit: 2020-02-22 | Discharge: 2020-02-22 | Disposition: A | Discharge status: Home / Self Care | Payer: PPO | Source: Ambulatory Visit | Attending: Radiation Oncology | Admitting: Radiation Oncology

## 2020-02-22 ENCOUNTER — Other Ambulatory Visit: Payer: Self-pay

## 2020-02-22 DIAGNOSIS — C61 Malignant neoplasm of prostate: Secondary | ICD-10-CM | POA: Diagnosis not present

## 2020-02-22 DIAGNOSIS — Z510 Encounter for antineoplastic radiation therapy: Secondary | ICD-10-CM | POA: Diagnosis not present

## 2020-02-23 ENCOUNTER — Encounter: Payer: Self-pay | Admitting: Medical Oncology

## 2020-02-23 ENCOUNTER — Other Ambulatory Visit: Payer: Self-pay

## 2020-02-23 ENCOUNTER — Ambulatory Visit
Admission: RE | Admit: 2020-02-23 | Discharge: 2020-02-23 | Disposition: A | Discharge status: Home / Self Care | Payer: PPO | Source: Ambulatory Visit | Attending: Radiation Oncology | Admitting: Radiation Oncology

## 2020-02-23 DIAGNOSIS — Z510 Encounter for antineoplastic radiation therapy: Secondary | ICD-10-CM | POA: Diagnosis not present

## 2020-02-23 DIAGNOSIS — C61 Malignant neoplasm of prostate: Secondary | ICD-10-CM | POA: Diagnosis not present

## 2020-02-26 ENCOUNTER — Ambulatory Visit
Admission: RE | Admit: 2020-02-26 | Discharge: 2020-02-26 | Disposition: A | Discharge status: Home / Self Care | Payer: PPO | Source: Ambulatory Visit | Attending: Radiation Oncology | Admitting: Radiation Oncology

## 2020-02-26 ENCOUNTER — Other Ambulatory Visit: Payer: Self-pay

## 2020-02-26 DIAGNOSIS — C61 Malignant neoplasm of prostate: Secondary | ICD-10-CM | POA: Insufficient documentation

## 2020-02-26 DIAGNOSIS — Z510 Encounter for antineoplastic radiation therapy: Secondary | ICD-10-CM | POA: Diagnosis not present

## 2020-02-27 ENCOUNTER — Ambulatory Visit
Admission: RE | Admit: 2020-02-27 | Discharge: 2020-02-27 | Disposition: A | Discharge status: Home / Self Care | Payer: PPO | Source: Ambulatory Visit | Attending: Radiation Oncology | Admitting: Radiation Oncology

## 2020-02-27 ENCOUNTER — Other Ambulatory Visit: Payer: Self-pay

## 2020-02-27 DIAGNOSIS — Z510 Encounter for antineoplastic radiation therapy: Secondary | ICD-10-CM | POA: Diagnosis not present

## 2020-02-27 DIAGNOSIS — C61 Malignant neoplasm of prostate: Secondary | ICD-10-CM | POA: Diagnosis not present

## 2020-02-28 ENCOUNTER — Ambulatory Visit
Admission: RE | Admit: 2020-02-28 | Discharge: 2020-02-28 | Disposition: A | Discharge status: Home / Self Care | Payer: PPO | Source: Ambulatory Visit | Attending: Radiation Oncology | Admitting: Radiation Oncology

## 2020-02-28 DIAGNOSIS — Z510 Encounter for antineoplastic radiation therapy: Secondary | ICD-10-CM | POA: Diagnosis not present

## 2020-02-28 DIAGNOSIS — C61 Malignant neoplasm of prostate: Secondary | ICD-10-CM | POA: Diagnosis not present

## 2020-02-29 ENCOUNTER — Ambulatory Visit
Admission: RE | Admit: 2020-02-29 | Discharge: 2020-02-29 | Disposition: A | Discharge status: Home / Self Care | Payer: PPO | Source: Ambulatory Visit | Attending: Radiation Oncology | Admitting: Radiation Oncology

## 2020-02-29 DIAGNOSIS — C61 Malignant neoplasm of prostate: Secondary | ICD-10-CM | POA: Diagnosis not present

## 2020-02-29 DIAGNOSIS — Z510 Encounter for antineoplastic radiation therapy: Secondary | ICD-10-CM | POA: Diagnosis not present

## 2020-03-01 ENCOUNTER — Ambulatory Visit
Admission: RE | Admit: 2020-03-01 | Discharge: 2020-03-01 | Disposition: A | Discharge status: Home / Self Care | Payer: PPO | Source: Ambulatory Visit | Attending: Radiation Oncology | Admitting: Radiation Oncology

## 2020-03-01 DIAGNOSIS — C61 Malignant neoplasm of prostate: Secondary | ICD-10-CM | POA: Diagnosis not present

## 2020-03-01 DIAGNOSIS — Z510 Encounter for antineoplastic radiation therapy: Secondary | ICD-10-CM | POA: Diagnosis not present

## 2020-03-04 ENCOUNTER — Ambulatory Visit
Admission: RE | Admit: 2020-03-04 | Discharge: 2020-03-04 | Disposition: A | Discharge status: Home / Self Care | Payer: PPO | Source: Ambulatory Visit | Attending: Radiation Oncology | Admitting: Radiation Oncology

## 2020-03-04 DIAGNOSIS — Z510 Encounter for antineoplastic radiation therapy: Secondary | ICD-10-CM | POA: Diagnosis not present

## 2020-03-04 DIAGNOSIS — C61 Malignant neoplasm of prostate: Secondary | ICD-10-CM | POA: Diagnosis not present

## 2020-03-05 ENCOUNTER — Ambulatory Visit
Admission: RE | Admit: 2020-03-05 | Discharge: 2020-03-05 | Disposition: A | Discharge status: Home / Self Care | Payer: PPO | Source: Ambulatory Visit | Attending: Radiation Oncology | Admitting: Radiation Oncology

## 2020-03-05 DIAGNOSIS — C61 Malignant neoplasm of prostate: Secondary | ICD-10-CM | POA: Diagnosis not present

## 2020-03-05 DIAGNOSIS — Z510 Encounter for antineoplastic radiation therapy: Secondary | ICD-10-CM | POA: Diagnosis not present

## 2020-03-06 ENCOUNTER — Ambulatory Visit
Admission: RE | Admit: 2020-03-06 | Discharge: 2020-03-06 | Disposition: A | Discharge status: Home / Self Care | Payer: PPO | Source: Ambulatory Visit | Attending: Radiation Oncology | Admitting: Radiation Oncology

## 2020-03-06 DIAGNOSIS — C61 Malignant neoplasm of prostate: Secondary | ICD-10-CM | POA: Diagnosis not present

## 2020-03-06 DIAGNOSIS — Z510 Encounter for antineoplastic radiation therapy: Secondary | ICD-10-CM | POA: Diagnosis not present

## 2020-03-07 ENCOUNTER — Ambulatory Visit
Admission: RE | Admit: 2020-03-07 | Discharge: 2020-03-07 | Disposition: A | Discharge status: Home / Self Care | Payer: PPO | Source: Ambulatory Visit | Attending: Radiation Oncology | Admitting: Radiation Oncology

## 2020-03-07 DIAGNOSIS — Z510 Encounter for antineoplastic radiation therapy: Secondary | ICD-10-CM | POA: Diagnosis not present

## 2020-03-07 DIAGNOSIS — C61 Malignant neoplasm of prostate: Secondary | ICD-10-CM | POA: Diagnosis not present

## 2020-03-08 ENCOUNTER — Ambulatory Visit
Admission: RE | Admit: 2020-03-08 | Discharge: 2020-03-08 | Disposition: A | Discharge status: Home / Self Care | Payer: PPO | Source: Ambulatory Visit | Attending: Radiation Oncology | Admitting: Radiation Oncology

## 2020-03-08 DIAGNOSIS — Z510 Encounter for antineoplastic radiation therapy: Secondary | ICD-10-CM | POA: Diagnosis not present

## 2020-03-08 DIAGNOSIS — C61 Malignant neoplasm of prostate: Secondary | ICD-10-CM | POA: Diagnosis not present

## 2020-03-11 ENCOUNTER — Ambulatory Visit
Admission: RE | Admit: 2020-03-11 | Discharge: 2020-03-11 | Disposition: A | Discharge status: Home / Self Care | Payer: PPO | Source: Ambulatory Visit | Attending: Radiation Oncology | Admitting: Radiation Oncology

## 2020-03-11 DIAGNOSIS — Z510 Encounter for antineoplastic radiation therapy: Secondary | ICD-10-CM | POA: Diagnosis not present

## 2020-03-11 DIAGNOSIS — C61 Malignant neoplasm of prostate: Secondary | ICD-10-CM | POA: Diagnosis not present

## 2020-03-12 ENCOUNTER — Ambulatory Visit
Admission: RE | Admit: 2020-03-12 | Discharge: 2020-03-12 | Disposition: A | Discharge status: Home / Self Care | Payer: PPO | Source: Ambulatory Visit | Attending: Radiation Oncology | Admitting: Radiation Oncology

## 2020-03-12 DIAGNOSIS — C61 Malignant neoplasm of prostate: Secondary | ICD-10-CM | POA: Diagnosis not present

## 2020-03-12 DIAGNOSIS — Z510 Encounter for antineoplastic radiation therapy: Secondary | ICD-10-CM | POA: Diagnosis not present

## 2020-03-13 ENCOUNTER — Ambulatory Visit
Admission: RE | Admit: 2020-03-13 | Discharge: 2020-03-13 | Disposition: A | Discharge status: Home / Self Care | Payer: PPO | Source: Ambulatory Visit | Attending: Radiation Oncology | Admitting: Radiation Oncology

## 2020-03-13 DIAGNOSIS — Z510 Encounter for antineoplastic radiation therapy: Secondary | ICD-10-CM | POA: Diagnosis not present

## 2020-03-13 DIAGNOSIS — C61 Malignant neoplasm of prostate: Secondary | ICD-10-CM | POA: Diagnosis not present

## 2020-03-14 ENCOUNTER — Ambulatory Visit
Admission: RE | Admit: 2020-03-14 | Discharge: 2020-03-14 | Disposition: A | Discharge status: Home / Self Care | Payer: PPO | Source: Ambulatory Visit | Attending: Radiation Oncology | Admitting: Radiation Oncology

## 2020-03-14 DIAGNOSIS — Z510 Encounter for antineoplastic radiation therapy: Secondary | ICD-10-CM | POA: Diagnosis not present

## 2020-03-14 DIAGNOSIS — C61 Malignant neoplasm of prostate: Secondary | ICD-10-CM | POA: Diagnosis not present

## 2020-03-15 ENCOUNTER — Ambulatory Visit
Admission: RE | Admit: 2020-03-15 | Discharge: 2020-03-15 | Disposition: A | Discharge status: Home / Self Care | Payer: PPO | Source: Ambulatory Visit | Attending: Radiation Oncology | Admitting: Radiation Oncology

## 2020-03-15 DIAGNOSIS — Z510 Encounter for antineoplastic radiation therapy: Secondary | ICD-10-CM | POA: Diagnosis not present

## 2020-03-15 DIAGNOSIS — C61 Malignant neoplasm of prostate: Secondary | ICD-10-CM | POA: Diagnosis not present

## 2020-03-17 ENCOUNTER — Ambulatory Visit
Admission: RE | Admit: 2020-03-17 | Discharge: 2020-03-17 | Disposition: A | Discharge status: Home / Self Care | Payer: PPO | Source: Ambulatory Visit | Attending: Radiation Oncology | Admitting: Radiation Oncology

## 2020-03-17 ENCOUNTER — Other Ambulatory Visit: Payer: Self-pay

## 2020-03-17 DIAGNOSIS — C61 Malignant neoplasm of prostate: Secondary | ICD-10-CM | POA: Diagnosis not present

## 2020-03-17 DIAGNOSIS — Z510 Encounter for antineoplastic radiation therapy: Secondary | ICD-10-CM | POA: Diagnosis not present

## 2020-03-18 ENCOUNTER — Other Ambulatory Visit: Payer: Self-pay

## 2020-03-18 ENCOUNTER — Ambulatory Visit
Admission: RE | Admit: 2020-03-18 | Discharge: 2020-03-18 | Disposition: A | Discharge status: Home / Self Care | Payer: PPO | Source: Ambulatory Visit | Attending: Radiation Oncology | Admitting: Radiation Oncology

## 2020-03-18 DIAGNOSIS — C61 Malignant neoplasm of prostate: Secondary | ICD-10-CM | POA: Diagnosis not present

## 2020-03-18 DIAGNOSIS — Z510 Encounter for antineoplastic radiation therapy: Secondary | ICD-10-CM | POA: Diagnosis not present

## 2020-03-19 ENCOUNTER — Ambulatory Visit
Admission: RE | Admit: 2020-03-19 | Discharge: 2020-03-19 | Disposition: A | Discharge status: Home / Self Care | Payer: PPO | Source: Ambulatory Visit | Attending: Radiation Oncology | Admitting: Radiation Oncology

## 2020-03-19 DIAGNOSIS — Z510 Encounter for antineoplastic radiation therapy: Secondary | ICD-10-CM | POA: Diagnosis not present

## 2020-03-19 DIAGNOSIS — C61 Malignant neoplasm of prostate: Secondary | ICD-10-CM | POA: Diagnosis not present

## 2020-03-20 ENCOUNTER — Ambulatory Visit
Admission: RE | Admit: 2020-03-20 | Discharge: 2020-03-20 | Disposition: A | Discharge status: Home / Self Care | Payer: PPO | Source: Ambulatory Visit | Attending: Radiation Oncology | Admitting: Radiation Oncology

## 2020-03-20 DIAGNOSIS — C61 Malignant neoplasm of prostate: Secondary | ICD-10-CM | POA: Diagnosis not present

## 2020-03-20 DIAGNOSIS — Z510 Encounter for antineoplastic radiation therapy: Secondary | ICD-10-CM | POA: Diagnosis not present

## 2020-03-24 ENCOUNTER — Ambulatory Visit: Payer: PPO

## 2020-03-25 ENCOUNTER — Ambulatory Visit
Admission: RE | Admit: 2020-03-25 | Discharge: 2020-03-25 | Disposition: A | Discharge status: Home / Self Care | Payer: PPO | Source: Ambulatory Visit | Attending: Radiation Oncology | Admitting: Radiation Oncology

## 2020-03-25 DIAGNOSIS — C61 Malignant neoplasm of prostate: Secondary | ICD-10-CM | POA: Diagnosis not present

## 2020-03-25 DIAGNOSIS — Z510 Encounter for antineoplastic radiation therapy: Secondary | ICD-10-CM | POA: Diagnosis not present

## 2020-03-26 ENCOUNTER — Ambulatory Visit
Admission: RE | Admit: 2020-03-26 | Discharge: 2020-03-26 | Disposition: A | Discharge status: Home / Self Care | Payer: PPO | Source: Ambulatory Visit | Attending: Radiation Oncology | Admitting: Radiation Oncology

## 2020-03-26 DIAGNOSIS — C61 Malignant neoplasm of prostate: Secondary | ICD-10-CM | POA: Diagnosis not present

## 2020-03-26 DIAGNOSIS — Z510 Encounter for antineoplastic radiation therapy: Secondary | ICD-10-CM | POA: Diagnosis not present

## 2020-03-27 ENCOUNTER — Ambulatory Visit
Admission: RE | Admit: 2020-03-27 | Discharge: 2020-03-27 | Disposition: A | Discharge status: Home / Self Care | Payer: PPO | Source: Ambulatory Visit | Attending: Radiation Oncology | Admitting: Radiation Oncology

## 2020-03-27 DIAGNOSIS — Z510 Encounter for antineoplastic radiation therapy: Secondary | ICD-10-CM | POA: Diagnosis not present

## 2020-03-27 DIAGNOSIS — C61 Malignant neoplasm of prostate: Secondary | ICD-10-CM | POA: Insufficient documentation

## 2020-03-28 ENCOUNTER — Ambulatory Visit
Admission: RE | Admit: 2020-03-28 | Discharge: 2020-03-28 | Disposition: A | Discharge status: Home / Self Care | Payer: PPO | Source: Ambulatory Visit | Attending: Radiation Oncology | Admitting: Radiation Oncology

## 2020-03-28 DIAGNOSIS — C61 Malignant neoplasm of prostate: Secondary | ICD-10-CM | POA: Diagnosis not present

## 2020-03-28 DIAGNOSIS — Z510 Encounter for antineoplastic radiation therapy: Secondary | ICD-10-CM | POA: Diagnosis not present

## 2020-03-29 ENCOUNTER — Ambulatory Visit
Admission: RE | Admit: 2020-03-29 | Discharge: 2020-03-29 | Disposition: A | Discharge status: Home / Self Care | Payer: PPO | Source: Ambulatory Visit | Attending: Radiation Oncology | Admitting: Radiation Oncology

## 2020-03-29 DIAGNOSIS — Z510 Encounter for antineoplastic radiation therapy: Secondary | ICD-10-CM | POA: Diagnosis not present

## 2020-03-29 DIAGNOSIS — C61 Malignant neoplasm of prostate: Secondary | ICD-10-CM | POA: Diagnosis not present

## 2020-04-01 ENCOUNTER — Ambulatory Visit
Admission: RE | Admit: 2020-04-01 | Discharge: 2020-04-01 | Disposition: A | Discharge status: Home / Self Care | Payer: PPO | Source: Ambulatory Visit | Attending: Radiation Oncology | Admitting: Radiation Oncology

## 2020-04-01 DIAGNOSIS — C61 Malignant neoplasm of prostate: Secondary | ICD-10-CM | POA: Diagnosis not present

## 2020-04-01 DIAGNOSIS — Z510 Encounter for antineoplastic radiation therapy: Secondary | ICD-10-CM | POA: Diagnosis not present

## 2020-04-02 ENCOUNTER — Ambulatory Visit
Admission: RE | Admit: 2020-04-02 | Discharge: 2020-04-02 | Disposition: A | Discharge status: Home / Self Care | Payer: PPO | Source: Ambulatory Visit | Attending: Radiation Oncology | Admitting: Radiation Oncology

## 2020-04-02 DIAGNOSIS — Z510 Encounter for antineoplastic radiation therapy: Secondary | ICD-10-CM | POA: Diagnosis not present

## 2020-04-02 DIAGNOSIS — C61 Malignant neoplasm of prostate: Secondary | ICD-10-CM | POA: Diagnosis not present

## 2020-04-02 DIAGNOSIS — G4733 Obstructive sleep apnea (adult) (pediatric): Secondary | ICD-10-CM | POA: Diagnosis not present

## 2020-04-03 ENCOUNTER — Ambulatory Visit
Admission: RE | Admit: 2020-04-03 | Discharge: 2020-04-03 | Disposition: A | Discharge status: Home / Self Care | Payer: PPO | Source: Ambulatory Visit | Attending: Radiation Oncology | Admitting: Radiation Oncology

## 2020-04-03 ENCOUNTER — Other Ambulatory Visit: Payer: Self-pay

## 2020-04-03 DIAGNOSIS — Z510 Encounter for antineoplastic radiation therapy: Secondary | ICD-10-CM | POA: Diagnosis not present

## 2020-04-03 DIAGNOSIS — C61 Malignant neoplasm of prostate: Secondary | ICD-10-CM | POA: Diagnosis not present

## 2020-04-04 ENCOUNTER — Ambulatory Visit
Admission: RE | Admit: 2020-04-04 | Discharge: 2020-04-04 | Disposition: A | Discharge status: Home / Self Care | Payer: PPO | Source: Ambulatory Visit | Attending: Radiation Oncology | Admitting: Radiation Oncology

## 2020-04-04 DIAGNOSIS — M9903 Segmental and somatic dysfunction of lumbar region: Secondary | ICD-10-CM | POA: Diagnosis not present

## 2020-04-04 DIAGNOSIS — M9907 Segmental and somatic dysfunction of upper extremity: Secondary | ICD-10-CM | POA: Diagnosis not present

## 2020-04-04 DIAGNOSIS — M9902 Segmental and somatic dysfunction of thoracic region: Secondary | ICD-10-CM | POA: Diagnosis not present

## 2020-04-04 DIAGNOSIS — M4727 Other spondylosis with radiculopathy, lumbosacral region: Secondary | ICD-10-CM | POA: Diagnosis not present

## 2020-04-04 DIAGNOSIS — C61 Malignant neoplasm of prostate: Secondary | ICD-10-CM | POA: Diagnosis not present

## 2020-04-04 DIAGNOSIS — M4728 Other spondylosis with radiculopathy, sacral and sacrococcygeal region: Secondary | ICD-10-CM | POA: Diagnosis not present

## 2020-04-04 DIAGNOSIS — M9904 Segmental and somatic dysfunction of sacral region: Secondary | ICD-10-CM | POA: Diagnosis not present

## 2020-04-04 DIAGNOSIS — Z510 Encounter for antineoplastic radiation therapy: Secondary | ICD-10-CM | POA: Diagnosis not present

## 2020-04-04 DIAGNOSIS — S43422A Sprain of left rotator cuff capsule, initial encounter: Secondary | ICD-10-CM | POA: Diagnosis not present

## 2020-04-04 DIAGNOSIS — M4724 Other spondylosis with radiculopathy, thoracic region: Secondary | ICD-10-CM | POA: Diagnosis not present

## 2020-04-05 ENCOUNTER — Ambulatory Visit
Admission: RE | Admit: 2020-04-05 | Discharge: 2020-04-05 | Disposition: A | Discharge status: Home / Self Care | Payer: PPO | Source: Ambulatory Visit | Attending: Radiation Oncology | Admitting: Radiation Oncology

## 2020-04-05 DIAGNOSIS — Z510 Encounter for antineoplastic radiation therapy: Secondary | ICD-10-CM | POA: Diagnosis not present

## 2020-04-05 DIAGNOSIS — C61 Malignant neoplasm of prostate: Secondary | ICD-10-CM | POA: Diagnosis not present

## 2020-04-08 ENCOUNTER — Ambulatory Visit: Payer: PPO

## 2020-04-09 ENCOUNTER — Ambulatory Visit
Admission: RE | Admit: 2020-04-09 | Discharge: 2020-04-09 | Disposition: A | Discharge status: Home / Self Care | Payer: PPO | Source: Ambulatory Visit | Attending: Radiation Oncology | Admitting: Radiation Oncology

## 2020-04-09 DIAGNOSIS — Z510 Encounter for antineoplastic radiation therapy: Secondary | ICD-10-CM | POA: Diagnosis not present

## 2020-04-09 DIAGNOSIS — C61 Malignant neoplasm of prostate: Secondary | ICD-10-CM | POA: Diagnosis not present

## 2020-04-10 ENCOUNTER — Ambulatory Visit
Admission: RE | Admit: 2020-04-10 | Discharge: 2020-04-10 | Disposition: A | Discharge status: Home / Self Care | Payer: PPO | Source: Ambulatory Visit | Attending: Radiation Oncology | Admitting: Radiation Oncology

## 2020-04-10 DIAGNOSIS — M9907 Segmental and somatic dysfunction of upper extremity: Secondary | ICD-10-CM | POA: Diagnosis not present

## 2020-04-10 DIAGNOSIS — M9904 Segmental and somatic dysfunction of sacral region: Secondary | ICD-10-CM | POA: Diagnosis not present

## 2020-04-10 DIAGNOSIS — C61 Malignant neoplasm of prostate: Secondary | ICD-10-CM | POA: Diagnosis not present

## 2020-04-10 DIAGNOSIS — M9902 Segmental and somatic dysfunction of thoracic region: Secondary | ICD-10-CM | POA: Diagnosis not present

## 2020-04-10 DIAGNOSIS — M9903 Segmental and somatic dysfunction of lumbar region: Secondary | ICD-10-CM | POA: Diagnosis not present

## 2020-04-10 DIAGNOSIS — M4728 Other spondylosis with radiculopathy, sacral and sacrococcygeal region: Secondary | ICD-10-CM | POA: Diagnosis not present

## 2020-04-10 DIAGNOSIS — M4727 Other spondylosis with radiculopathy, lumbosacral region: Secondary | ICD-10-CM | POA: Diagnosis not present

## 2020-04-10 DIAGNOSIS — M4724 Other spondylosis with radiculopathy, thoracic region: Secondary | ICD-10-CM | POA: Diagnosis not present

## 2020-04-10 DIAGNOSIS — Z510 Encounter for antineoplastic radiation therapy: Secondary | ICD-10-CM | POA: Diagnosis not present

## 2020-04-10 DIAGNOSIS — S43422A Sprain of left rotator cuff capsule, initial encounter: Secondary | ICD-10-CM | POA: Diagnosis not present

## 2020-04-11 ENCOUNTER — Ambulatory Visit
Admission: RE | Admit: 2020-04-11 | Discharge: 2020-04-11 | Disposition: A | Discharge status: Home / Self Care | Payer: PPO | Source: Ambulatory Visit | Attending: Radiation Oncology | Admitting: Radiation Oncology

## 2020-04-11 ENCOUNTER — Other Ambulatory Visit: Payer: Self-pay

## 2020-04-11 DIAGNOSIS — M4728 Other spondylosis with radiculopathy, sacral and sacrococcygeal region: Secondary | ICD-10-CM | POA: Diagnosis not present

## 2020-04-11 DIAGNOSIS — M9903 Segmental and somatic dysfunction of lumbar region: Secondary | ICD-10-CM | POA: Diagnosis not present

## 2020-04-11 DIAGNOSIS — M9902 Segmental and somatic dysfunction of thoracic region: Secondary | ICD-10-CM | POA: Diagnosis not present

## 2020-04-11 DIAGNOSIS — Z510 Encounter for antineoplastic radiation therapy: Secondary | ICD-10-CM | POA: Diagnosis not present

## 2020-04-11 DIAGNOSIS — S43422A Sprain of left rotator cuff capsule, initial encounter: Secondary | ICD-10-CM | POA: Diagnosis not present

## 2020-04-11 DIAGNOSIS — M4727 Other spondylosis with radiculopathy, lumbosacral region: Secondary | ICD-10-CM | POA: Diagnosis not present

## 2020-04-11 DIAGNOSIS — C61 Malignant neoplasm of prostate: Secondary | ICD-10-CM | POA: Diagnosis not present

## 2020-04-11 DIAGNOSIS — M9907 Segmental and somatic dysfunction of upper extremity: Secondary | ICD-10-CM | POA: Diagnosis not present

## 2020-04-11 DIAGNOSIS — M4724 Other spondylosis with radiculopathy, thoracic region: Secondary | ICD-10-CM | POA: Diagnosis not present

## 2020-04-11 DIAGNOSIS — M9904 Segmental and somatic dysfunction of sacral region: Secondary | ICD-10-CM | POA: Diagnosis not present

## 2020-04-12 ENCOUNTER — Ambulatory Visit
Admission: RE | Admit: 2020-04-12 | Discharge: 2020-04-12 | Disposition: A | Discharge status: Home / Self Care | Payer: PPO | Source: Ambulatory Visit | Attending: Radiation Oncology | Admitting: Radiation Oncology

## 2020-04-12 ENCOUNTER — Other Ambulatory Visit: Payer: Self-pay

## 2020-04-12 DIAGNOSIS — C61 Malignant neoplasm of prostate: Secondary | ICD-10-CM | POA: Diagnosis not present

## 2020-04-12 DIAGNOSIS — Z510 Encounter for antineoplastic radiation therapy: Secondary | ICD-10-CM | POA: Diagnosis not present

## 2020-04-15 ENCOUNTER — Ambulatory Visit
Admission: RE | Admit: 2020-04-15 | Discharge: 2020-04-15 | Disposition: A | Discharge status: Home / Self Care | Payer: PPO | Source: Ambulatory Visit | Attending: Radiation Oncology | Admitting: Radiation Oncology

## 2020-04-15 DIAGNOSIS — C61 Malignant neoplasm of prostate: Secondary | ICD-10-CM | POA: Diagnosis not present

## 2020-04-15 DIAGNOSIS — Z510 Encounter for antineoplastic radiation therapy: Secondary | ICD-10-CM | POA: Diagnosis not present

## 2020-04-16 ENCOUNTER — Ambulatory Visit: Payer: PPO

## 2020-04-16 ENCOUNTER — Ambulatory Visit
Admission: RE | Admit: 2020-04-16 | Discharge: 2020-04-16 | Disposition: A | Discharge status: Home / Self Care | Payer: PPO | Source: Ambulatory Visit | Attending: Radiation Oncology | Admitting: Radiation Oncology

## 2020-04-16 DIAGNOSIS — Z510 Encounter for antineoplastic radiation therapy: Secondary | ICD-10-CM | POA: Diagnosis not present

## 2020-04-16 DIAGNOSIS — C61 Malignant neoplasm of prostate: Secondary | ICD-10-CM | POA: Diagnosis not present

## 2020-04-17 ENCOUNTER — Ambulatory Visit
Admission: RE | Admit: 2020-04-17 | Discharge: 2020-04-17 | Disposition: A | Discharge status: Home / Self Care | Payer: PPO | Source: Ambulatory Visit | Attending: Radiation Oncology | Admitting: Radiation Oncology

## 2020-04-17 ENCOUNTER — Encounter: Payer: Self-pay | Admitting: Radiation Oncology

## 2020-04-17 ENCOUNTER — Ambulatory Visit: Payer: PPO

## 2020-04-17 DIAGNOSIS — M9902 Segmental and somatic dysfunction of thoracic region: Secondary | ICD-10-CM | POA: Diagnosis not present

## 2020-04-17 DIAGNOSIS — M4727 Other spondylosis with radiculopathy, lumbosacral region: Secondary | ICD-10-CM | POA: Diagnosis not present

## 2020-04-17 DIAGNOSIS — M9903 Segmental and somatic dysfunction of lumbar region: Secondary | ICD-10-CM | POA: Diagnosis not present

## 2020-04-17 DIAGNOSIS — C61 Malignant neoplasm of prostate: Secondary | ICD-10-CM | POA: Diagnosis not present

## 2020-04-17 DIAGNOSIS — M4728 Other spondylosis with radiculopathy, sacral and sacrococcygeal region: Secondary | ICD-10-CM | POA: Diagnosis not present

## 2020-04-17 DIAGNOSIS — S43422A Sprain of left rotator cuff capsule, initial encounter: Secondary | ICD-10-CM | POA: Diagnosis not present

## 2020-04-17 DIAGNOSIS — M9904 Segmental and somatic dysfunction of sacral region: Secondary | ICD-10-CM | POA: Diagnosis not present

## 2020-04-17 DIAGNOSIS — M9907 Segmental and somatic dysfunction of upper extremity: Secondary | ICD-10-CM | POA: Diagnosis not present

## 2020-04-17 DIAGNOSIS — Z510 Encounter for antineoplastic radiation therapy: Secondary | ICD-10-CM | POA: Diagnosis not present

## 2020-04-17 DIAGNOSIS — M4724 Other spondylosis with radiculopathy, thoracic region: Secondary | ICD-10-CM | POA: Diagnosis not present

## 2020-04-18 ENCOUNTER — Encounter: Payer: Self-pay | Admitting: Medical Oncology

## 2020-04-27 DIAGNOSIS — G4733 Obstructive sleep apnea (adult) (pediatric): Secondary | ICD-10-CM | POA: Diagnosis not present

## 2020-05-03 DIAGNOSIS — G4733 Obstructive sleep apnea (adult) (pediatric): Secondary | ICD-10-CM | POA: Diagnosis not present

## 2020-05-07 DIAGNOSIS — M4724 Other spondylosis with radiculopathy, thoracic region: Secondary | ICD-10-CM | POA: Diagnosis not present

## 2020-05-07 DIAGNOSIS — M9904 Segmental and somatic dysfunction of sacral region: Secondary | ICD-10-CM | POA: Diagnosis not present

## 2020-05-07 DIAGNOSIS — M9903 Segmental and somatic dysfunction of lumbar region: Secondary | ICD-10-CM | POA: Diagnosis not present

## 2020-05-07 DIAGNOSIS — M9902 Segmental and somatic dysfunction of thoracic region: Secondary | ICD-10-CM | POA: Diagnosis not present

## 2020-05-07 DIAGNOSIS — M4728 Other spondylosis with radiculopathy, sacral and sacrococcygeal region: Secondary | ICD-10-CM | POA: Diagnosis not present

## 2020-05-07 DIAGNOSIS — M9907 Segmental and somatic dysfunction of upper extremity: Secondary | ICD-10-CM | POA: Diagnosis not present

## 2020-05-07 DIAGNOSIS — M4727 Other spondylosis with radiculopathy, lumbosacral region: Secondary | ICD-10-CM | POA: Diagnosis not present

## 2020-05-07 DIAGNOSIS — S43422A Sprain of left rotator cuff capsule, initial encounter: Secondary | ICD-10-CM | POA: Diagnosis not present

## 2020-05-13 NOTE — Progress Notes (Signed)
  Radiation Oncology         [phone]) [phone] ________________________________  Name: Rodney Hughes MRN: [id number]  Date: 04/17/2020  DOB: 1950-12-26  End of Treatment Note  Diagnosis:   71 y.o. 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     Indication for treatment:  Curative, Definitive Radiotherapy       Radiation treatment dates:   10/28-12/22/21  Site/dose:  1. The prostate fossa and pelvic lymph nodes were initially treated to 45 Gy in 25 fractions of 1.8 Gy  2. The prostate fossa only was boosted to 68.4 Gy with 13 additional fractions of 1.8 Gy   Beams/energy:  1. The prostate fossa  and pelvic lymph nodes were initially treated using VMAT intensity modulated radiotherapy delivering 6 megavolt photons. Image guidance was performed with CB-CT studies prior to each fraction. He was immobilized with a body fix lower extremity mold.  2. The prostate fossa only was boosted using VMAT intensity modulated radiotherapy delivering 6 megavolt photons. Image guidance was performed with CB-CT studies prior to each fraction. He was immobilized with a body fix lower extremity mold.  Narrative: The patient tolerated radiation treatment relatively well.   The patient experienced some minor urinary irritation and modest fatigue.    Plan: The patient has completed radiation treatment. He will return to radiation oncology clinic for routine followup in one month. I advised him to call or return sooner if he has any questions or concerns related to his recovery or treatment. ________________________________  Sheral Apley. Tammi Klippel, M.D.

## 2020-05-14 DIAGNOSIS — Z20822 Contact with and (suspected) exposure to covid-19: Secondary | ICD-10-CM | POA: Diagnosis not present

## 2020-05-14 DIAGNOSIS — E1169 Type 2 diabetes mellitus with other specified complication: Secondary | ICD-10-CM | POA: Diagnosis not present

## 2020-05-14 DIAGNOSIS — I10 Essential (primary) hypertension: Secondary | ICD-10-CM | POA: Diagnosis not present

## 2020-05-14 DIAGNOSIS — E785 Hyperlipidemia, unspecified: Secondary | ICD-10-CM | POA: Diagnosis not present

## 2020-05-14 DIAGNOSIS — C61 Malignant neoplasm of prostate: Secondary | ICD-10-CM | POA: Diagnosis not present

## 2020-05-20 ENCOUNTER — Telehealth: Payer: Self-pay

## 2020-05-20 ENCOUNTER — Encounter: Payer: Self-pay | Admitting: Urology

## 2020-05-20 ENCOUNTER — Other Ambulatory Visit: Payer: Self-pay

## 2020-05-20 DIAGNOSIS — C61 Malignant neoplasm of prostate: Secondary | ICD-10-CM | POA: Diagnosis not present

## 2020-05-20 NOTE — Progress Notes (Signed)
Patient has telephone visit with Ashlyn Bruning PA . Patient states nocturia 1 time per night. Patient denies dysuria or hematuria. Patient states urine stream is strong and steady. Patient states that he is emptying his bladder completely. Patient states urgency and is able to hold urine. Patient denies having to push or strain during urination. Patient denies fatigue. Patient states having a small amount of leakage. Patient states that he has a follow-up appointment with [facility].

## 2020-05-20 NOTE — Telephone Encounter (Signed)
Spoke with patient in regards to 1 month telephone follow-up with Rodney Caldron PA on 05/22/20 @ 9:00am. Patient verbalized understanding of appointment date and time. Reviewed meaningful use, AUA and prostate questions. TM

## 2020-05-21 DIAGNOSIS — M9903 Segmental and somatic dysfunction of lumbar region: Secondary | ICD-10-CM | POA: Diagnosis not present

## 2020-05-21 DIAGNOSIS — M9902 Segmental and somatic dysfunction of thoracic region: Secondary | ICD-10-CM | POA: Diagnosis not present

## 2020-05-21 DIAGNOSIS — M4728 Other spondylosis with radiculopathy, sacral and sacrococcygeal region: Secondary | ICD-10-CM | POA: Diagnosis not present

## 2020-05-21 DIAGNOSIS — M4727 Other spondylosis with radiculopathy, lumbosacral region: Secondary | ICD-10-CM | POA: Diagnosis not present

## 2020-05-21 DIAGNOSIS — S43422A Sprain of left rotator cuff capsule, initial encounter: Secondary | ICD-10-CM | POA: Diagnosis not present

## 2020-05-21 DIAGNOSIS — M9904 Segmental and somatic dysfunction of sacral region: Secondary | ICD-10-CM | POA: Diagnosis not present

## 2020-05-21 DIAGNOSIS — M4724 Other spondylosis with radiculopathy, thoracic region: Secondary | ICD-10-CM | POA: Diagnosis not present

## 2020-05-21 DIAGNOSIS — M9907 Segmental and somatic dysfunction of upper extremity: Secondary | ICD-10-CM | POA: Diagnosis not present

## 2020-05-22 ENCOUNTER — Ambulatory Visit
Admission: RE | Admit: 2020-05-22 | Discharge: 2020-05-22 | Disposition: A | Discharge status: Home / Self Care | Payer: PPO | Source: Ambulatory Visit | Attending: Urology | Admitting: Urology

## 2020-05-22 ENCOUNTER — Other Ambulatory Visit: Payer: Self-pay

## 2020-05-22 DIAGNOSIS — N393 Stress incontinence (female) (male): Secondary | ICD-10-CM | POA: Diagnosis not present

## 2020-05-22 DIAGNOSIS — C61 Malignant neoplasm of prostate: Secondary | ICD-10-CM | POA: Diagnosis not present

## 2020-05-22 DIAGNOSIS — N5201 Erectile dysfunction due to arterial insufficiency: Secondary | ICD-10-CM | POA: Diagnosis not present

## 2020-05-23 NOTE — Progress Notes (Signed)
Radiation Oncology         [phone]) [phone] ________________________________  Name: Rodney Hughes MRN: [id number]  Date: 05/22/2020  DOB: 1950-10-17  Post Treatment Note  CC: Rodney Anon, MD  Diagnosis:   70 y.o. 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   Interval Since Last Radiation:  5 weeks  10/28-12/22/21: 1. The prostate fossa and pelvic lymph nodes were initially treated to 45 Gy in 25 fractions of 1.8 Gy  2. The prostate fossa only was boosted to 68.4 Gy with 13 additional fractions of 1.8 Gy   Narrative:  The patient returns today for routine follow-up.  He tolerated radiation treatment relatively well.   The patient experienced some minor urinary irritation and modest fatigue.                               On review of systems, the patient states That he is doing well in general.  He reports nocturia x1 per night and continues with mild increased urgency but denies incontinence.  He reports a good flow of stream and feels that he is emptying his bladder well on voiding.  He denies any dysuria or gross hematuria.  He reports that his bowels are back to normal and specifically denies abdominal pain, nausea, vomiting, diarrhea or constipation.  He reports a healthy appetite and is maintaining his weight.  His energy level is gradually improving.  He has not appointment to follow-up with neurology today at 10:30 AM.  ALLERGIES:  has No Known Allergies.  Meds: Current Outpatient Medications  Medication Sig Dispense Refill  . amLODipine (NORVASC) 10 MG tablet TAKE 1 TABLET BY MOUTH EVERY DAY (Patient taking differently: Take 10 mg by mouth daily.) 90 tablet 1  . glipiZIDE (GLUCOTROL) 5 MG tablet Take 5 mg by mouth daily.    . hydrALAZINE (APRESOLINE) 25 MG tablet Take 25 mg by mouth 3  (three) times daily.    [name] losartan (COZAAR) 100 MG tablet TAKE 1 TABLET BY MOUTH EVERY DAY (Patient taking differently: Take 100 mg by mouth daily.) 30 tablet 5  . metFORMIN (GLUCOPHAGE-XR) 500 MG 24 hr tablet Take 1,000 mg by mouth in the morning and at bedtime.     . metoprolol succinate (TOPROL-XL) 100 MG 24 hr tablet TAKE 1 TABLET BY MOUTH DAILY WITH OR IMMEDIATELY FOLLOWOING A MEAL (Patient taking differently: Take 100 mg by mouth daily.) 90 tablet 1  . rosuvastatin (CRESTOR) 10 MG tablet Take 10 mg by mouth at bedtime.     No current facility-administered medications for this encounter.    Physical Findings:  vitals were not taken for this visit.   /Unable to assess due to telephone follow-up visit format.  Lab Findings: Lab Results  Component Value Date   WBC 7.9 07/10/2019   HGB 11.2 (L) 07/21/2019   HCT 33.8 (L) 07/21/2019   MCV 88.9 07/10/2019   PLT 169 07/10/2019     Radiographic Findings: No results found.  Impression/Plan: 1. 70 y.o. 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   He will continue to follow up with urology for ongoing PSA determinations and has an appointment scheduled with  Dr. Alinda Money at 10:30 AM today. He understands what to expect with regards to PSA monitoring going forward. I will look forward to following his response to treatment via correspondence with urology, and would be happy to continue to participate in his care if clinically indicated. I talked to the patient about what to expect in the future, including his risk for erectile dysfunction and rectal bleeding. I encouraged him to call or return to the office if he has any questions regarding his previous radiation or possible radiation side effects. He was comfortable with this plan and will follow up as needed.   Nicholos Johns, PA-C

## 2020-05-27 DIAGNOSIS — S43422A Sprain of left rotator cuff capsule, initial encounter: Secondary | ICD-10-CM | POA: Diagnosis not present

## 2020-05-27 DIAGNOSIS — M9903 Segmental and somatic dysfunction of lumbar region: Secondary | ICD-10-CM | POA: Diagnosis not present

## 2020-05-27 DIAGNOSIS — M4728 Other spondylosis with radiculopathy, sacral and sacrococcygeal region: Secondary | ICD-10-CM | POA: Diagnosis not present

## 2020-05-27 DIAGNOSIS — M9904 Segmental and somatic dysfunction of sacral region: Secondary | ICD-10-CM | POA: Diagnosis not present

## 2020-05-27 DIAGNOSIS — M4724 Other spondylosis with radiculopathy, thoracic region: Secondary | ICD-10-CM | POA: Diagnosis not present

## 2020-05-27 DIAGNOSIS — M4727 Other spondylosis with radiculopathy, lumbosacral region: Secondary | ICD-10-CM | POA: Diagnosis not present

## 2020-05-27 DIAGNOSIS — M9907 Segmental and somatic dysfunction of upper extremity: Secondary | ICD-10-CM | POA: Diagnosis not present

## 2020-05-27 DIAGNOSIS — M9902 Segmental and somatic dysfunction of thoracic region: Secondary | ICD-10-CM | POA: Diagnosis not present

## 2020-05-29 DIAGNOSIS — G4733 Obstructive sleep apnea (adult) (pediatric): Secondary | ICD-10-CM | POA: Diagnosis not present

## 2020-05-29 DIAGNOSIS — I10 Essential (primary) hypertension: Secondary | ICD-10-CM | POA: Diagnosis not present

## 2020-05-29 DIAGNOSIS — Z9989 Dependence on other enabling machines and devices: Secondary | ICD-10-CM | POA: Diagnosis not present

## 2020-05-30 DIAGNOSIS — M9903 Segmental and somatic dysfunction of lumbar region: Secondary | ICD-10-CM | POA: Diagnosis not present

## 2020-05-30 DIAGNOSIS — M9907 Segmental and somatic dysfunction of upper extremity: Secondary | ICD-10-CM | POA: Diagnosis not present

## 2020-05-30 DIAGNOSIS — M9902 Segmental and somatic dysfunction of thoracic region: Secondary | ICD-10-CM | POA: Diagnosis not present

## 2020-05-30 DIAGNOSIS — M4724 Other spondylosis with radiculopathy, thoracic region: Secondary | ICD-10-CM | POA: Diagnosis not present

## 2020-05-30 DIAGNOSIS — M4728 Other spondylosis with radiculopathy, sacral and sacrococcygeal region: Secondary | ICD-10-CM | POA: Diagnosis not present

## 2020-05-30 DIAGNOSIS — M9904 Segmental and somatic dysfunction of sacral region: Secondary | ICD-10-CM | POA: Diagnosis not present

## 2020-05-30 DIAGNOSIS — M4727 Other spondylosis with radiculopathy, lumbosacral region: Secondary | ICD-10-CM | POA: Diagnosis not present

## 2020-05-30 DIAGNOSIS — S43422A Sprain of left rotator cuff capsule, initial encounter: Secondary | ICD-10-CM | POA: Diagnosis not present

## 2020-06-03 DIAGNOSIS — G4733 Obstructive sleep apnea (adult) (pediatric): Secondary | ICD-10-CM | POA: Diagnosis not present

## 2020-06-19 DIAGNOSIS — S43422A Sprain of left rotator cuff capsule, initial encounter: Secondary | ICD-10-CM | POA: Diagnosis not present

## 2020-06-19 DIAGNOSIS — M4728 Other spondylosis with radiculopathy, sacral and sacrococcygeal region: Secondary | ICD-10-CM | POA: Diagnosis not present

## 2020-06-19 DIAGNOSIS — M9902 Segmental and somatic dysfunction of thoracic region: Secondary | ICD-10-CM | POA: Diagnosis not present

## 2020-06-19 DIAGNOSIS — M9903 Segmental and somatic dysfunction of lumbar region: Secondary | ICD-10-CM | POA: Diagnosis not present

## 2020-06-19 DIAGNOSIS — M9904 Segmental and somatic dysfunction of sacral region: Secondary | ICD-10-CM | POA: Diagnosis not present

## 2020-06-19 DIAGNOSIS — M4727 Other spondylosis with radiculopathy, lumbosacral region: Secondary | ICD-10-CM | POA: Diagnosis not present

## 2020-06-19 DIAGNOSIS — M4724 Other spondylosis with radiculopathy, thoracic region: Secondary | ICD-10-CM | POA: Diagnosis not present

## 2020-06-19 DIAGNOSIS — M9907 Segmental and somatic dysfunction of upper extremity: Secondary | ICD-10-CM | POA: Diagnosis not present

## 2020-07-04 DIAGNOSIS — E119 Type 2 diabetes mellitus without complications: Secondary | ICD-10-CM | POA: Diagnosis not present

## 2020-07-04 DIAGNOSIS — Z7984 Long term (current) use of oral hypoglycemic drugs: Secondary | ICD-10-CM | POA: Diagnosis not present

## 2020-07-04 DIAGNOSIS — H2513 Age-related nuclear cataract, bilateral: Secondary | ICD-10-CM | POA: Diagnosis not present

## 2020-07-16 DIAGNOSIS — E1169 Type 2 diabetes mellitus with other specified complication: Secondary | ICD-10-CM | POA: Diagnosis not present

## 2020-07-16 DIAGNOSIS — E785 Hyperlipidemia, unspecified: Secondary | ICD-10-CM | POA: Diagnosis not present

## 2020-07-17 DIAGNOSIS — G4733 Obstructive sleep apnea (adult) (pediatric): Secondary | ICD-10-CM | POA: Diagnosis not present

## 2020-07-18 DIAGNOSIS — G4733 Obstructive sleep apnea (adult) (pediatric): Secondary | ICD-10-CM | POA: Diagnosis not present

## 2020-07-18 DIAGNOSIS — Z0000 Encounter for general adult medical examination without abnormal findings: Secondary | ICD-10-CM | POA: Diagnosis not present

## 2020-07-18 DIAGNOSIS — I10 Essential (primary) hypertension: Secondary | ICD-10-CM | POA: Diagnosis not present

## 2020-07-18 DIAGNOSIS — E785 Hyperlipidemia, unspecified: Secondary | ICD-10-CM | POA: Diagnosis not present

## 2020-07-18 DIAGNOSIS — E1169 Type 2 diabetes mellitus with other specified complication: Secondary | ICD-10-CM | POA: Diagnosis not present

## 2020-08-17 DIAGNOSIS — G4733 Obstructive sleep apnea (adult) (pediatric): Secondary | ICD-10-CM | POA: Diagnosis not present

## 2020-08-22 ENCOUNTER — Encounter: Payer: Self-pay | Admitting: Gastroenterology

## 2020-09-16 DIAGNOSIS — G4733 Obstructive sleep apnea (adult) (pediatric): Secondary | ICD-10-CM | POA: Diagnosis not present

## 2020-10-17 DIAGNOSIS — G4733 Obstructive sleep apnea (adult) (pediatric): Secondary | ICD-10-CM | POA: Diagnosis not present

## 2020-11-12 DIAGNOSIS — C61 Malignant neoplasm of prostate: Secondary | ICD-10-CM | POA: Diagnosis not present

## 2020-11-14 DIAGNOSIS — L570 Actinic keratosis: Secondary | ICD-10-CM | POA: Diagnosis not present

## 2020-11-14 DIAGNOSIS — L821 Other seborrheic keratosis: Secondary | ICD-10-CM | POA: Diagnosis not present

## 2020-11-14 DIAGNOSIS — L578 Other skin changes due to chronic exposure to nonionizing radiation: Secondary | ICD-10-CM | POA: Diagnosis not present

## 2020-11-14 DIAGNOSIS — D225 Melanocytic nevi of trunk: Secondary | ICD-10-CM | POA: Diagnosis not present

## 2020-11-14 DIAGNOSIS — L814 Other melanin hyperpigmentation: Secondary | ICD-10-CM | POA: Diagnosis not present

## 2020-11-14 DIAGNOSIS — L820 Inflamed seborrheic keratosis: Secondary | ICD-10-CM | POA: Diagnosis not present

## 2020-11-14 DIAGNOSIS — L918 Other hypertrophic disorders of the skin: Secondary | ICD-10-CM | POA: Diagnosis not present

## 2020-11-16 DIAGNOSIS — G4733 Obstructive sleep apnea (adult) (pediatric): Secondary | ICD-10-CM | POA: Diagnosis not present

## 2020-11-18 DIAGNOSIS — C61 Malignant neoplasm of prostate: Secondary | ICD-10-CM | POA: Diagnosis not present

## 2020-11-18 DIAGNOSIS — N5201 Erectile dysfunction due to arterial insufficiency: Secondary | ICD-10-CM | POA: Diagnosis not present

## 2020-12-17 DIAGNOSIS — G4733 Obstructive sleep apnea (adult) (pediatric): Secondary | ICD-10-CM | POA: Diagnosis not present

## 2021-01-17 DIAGNOSIS — G4733 Obstructive sleep apnea (adult) (pediatric): Secondary | ICD-10-CM | POA: Diagnosis not present

## 2021-02-16 DIAGNOSIS — G4733 Obstructive sleep apnea (adult) (pediatric): Secondary | ICD-10-CM | POA: Diagnosis not present

## 2021-03-19 DIAGNOSIS — G4733 Obstructive sleep apnea (adult) (pediatric): Secondary | ICD-10-CM | POA: Diagnosis not present

## 2021-03-25 DIAGNOSIS — Z1211 Encounter for screening for malignant neoplasm of colon: Secondary | ICD-10-CM | POA: Diagnosis not present

## 2021-03-25 DIAGNOSIS — G4730 Sleep apnea, unspecified: Secondary | ICD-10-CM | POA: Diagnosis not present

## 2021-03-25 DIAGNOSIS — Z79899 Other long term (current) drug therapy: Secondary | ICD-10-CM | POA: Diagnosis not present

## 2021-03-25 DIAGNOSIS — E1169 Type 2 diabetes mellitus with other specified complication: Secondary | ICD-10-CM | POA: Diagnosis not present

## 2021-03-25 DIAGNOSIS — E785 Hyperlipidemia, unspecified: Secondary | ICD-10-CM | POA: Diagnosis not present

## 2021-03-25 DIAGNOSIS — I10 Essential (primary) hypertension: Secondary | ICD-10-CM | POA: Diagnosis not present

## 2023-11-15 ENCOUNTER — Other Ambulatory Visit (HOSPITAL_BASED_OUTPATIENT_CLINIC_OR_DEPARTMENT_OTHER): Payer: Self-pay

## 2023-11-15 MED ORDER — MOUNJARO 2.5 MG/0.5ML ~~LOC~~ SOAJ
2.5000 mg | SUBCUTANEOUS | 0 refills | Status: DC
  Filled 2023-11-15: qty 2, 28d supply, fill #0

## 2023-11-16 ENCOUNTER — Other Ambulatory Visit (HOSPITAL_BASED_OUTPATIENT_CLINIC_OR_DEPARTMENT_OTHER): Payer: Self-pay

## 2023-11-17 ENCOUNTER — Other Ambulatory Visit (HOSPITAL_BASED_OUTPATIENT_CLINIC_OR_DEPARTMENT_OTHER): Payer: Self-pay

## 2023-11-18 ENCOUNTER — Other Ambulatory Visit (HOSPITAL_BASED_OUTPATIENT_CLINIC_OR_DEPARTMENT_OTHER): Payer: Self-pay

## 2023-11-19 ENCOUNTER — Other Ambulatory Visit (HOSPITAL_BASED_OUTPATIENT_CLINIC_OR_DEPARTMENT_OTHER): Payer: Self-pay

## 2023-12-21 ENCOUNTER — Other Ambulatory Visit (HOSPITAL_BASED_OUTPATIENT_CLINIC_OR_DEPARTMENT_OTHER): Payer: Self-pay

## 2023-12-21 MED ORDER — MOUNJARO 2.5 MG/0.5ML ~~LOC~~ SOAJ
2.5000 mg | SUBCUTANEOUS | 0 refills | Status: DC
  Filled 2023-12-21: qty 2, 28d supply, fill #0

## 2024-01-11 ENCOUNTER — Other Ambulatory Visit (HOSPITAL_BASED_OUTPATIENT_CLINIC_OR_DEPARTMENT_OTHER): Payer: Self-pay

## 2024-01-11 MED ORDER — MOUNJARO 2.5 MG/0.5ML ~~LOC~~ SOAJ
2.5000 mg | SUBCUTANEOUS | 0 refills | Status: DC
  Filled 2024-01-17: qty 2, 28d supply, fill #0

## 2024-01-17 ENCOUNTER — Other Ambulatory Visit (HOSPITAL_BASED_OUTPATIENT_CLINIC_OR_DEPARTMENT_OTHER): Payer: Self-pay

## 2024-02-15 ENCOUNTER — Other Ambulatory Visit (HOSPITAL_BASED_OUTPATIENT_CLINIC_OR_DEPARTMENT_OTHER): Payer: Self-pay

## 2024-02-15 MED ORDER — MOUNJARO 5 MG/0.5ML ~~LOC~~ SOAJ
5.0000 mg | SUBCUTANEOUS | 0 refills | Status: DC
  Filled 2024-02-15: qty 2, 28d supply, fill #0

## 2024-03-14 ENCOUNTER — Other Ambulatory Visit (HOSPITAL_BASED_OUTPATIENT_CLINIC_OR_DEPARTMENT_OTHER): Payer: Self-pay

## 2024-03-14 MED ORDER — MOUNJARO 5 MG/0.5ML ~~LOC~~ SOAJ
5.0000 mg | SUBCUTANEOUS | 0 refills | Status: DC
  Filled 2024-03-14: qty 2, 28d supply, fill #0

## 2024-04-11 ENCOUNTER — Other Ambulatory Visit (HOSPITAL_BASED_OUTPATIENT_CLINIC_OR_DEPARTMENT_OTHER): Payer: Self-pay

## 2024-04-11 MED ORDER — MOUNJARO 5 MG/0.5ML ~~LOC~~ SOAJ
5.0000 mg | SUBCUTANEOUS | 0 refills | Status: DC
  Filled 2024-04-11: qty 2, 28d supply, fill #0

## 2024-04-15 ENCOUNTER — Other Ambulatory Visit (HOSPITAL_BASED_OUTPATIENT_CLINIC_OR_DEPARTMENT_OTHER): Payer: Self-pay

## 2024-05-09 ENCOUNTER — Other Ambulatory Visit (HOSPITAL_BASED_OUTPATIENT_CLINIC_OR_DEPARTMENT_OTHER): Payer: Self-pay

## 2024-05-09 MED ORDER — MOUNJARO 5 MG/0.5ML ~~LOC~~ SOAJ
5.0000 mg | SUBCUTANEOUS | 0 refills | Status: AC
  Filled 2024-05-09 – 2024-05-12 (×2): qty 2, 28d supply, fill #0

## 2024-05-12 ENCOUNTER — Other Ambulatory Visit (HOSPITAL_BASED_OUTPATIENT_CLINIC_OR_DEPARTMENT_OTHER): Payer: Self-pay

## 2024-05-13 ENCOUNTER — Other Ambulatory Visit (HOSPITAL_BASED_OUTPATIENT_CLINIC_OR_DEPARTMENT_OTHER): Payer: Self-pay

## 2024-05-15 ENCOUNTER — Other Ambulatory Visit (HOSPITAL_BASED_OUTPATIENT_CLINIC_OR_DEPARTMENT_OTHER): Payer: Self-pay
